# Patient Record
Sex: Female | Born: 1935 | Smoking: Never smoker
Health system: Southern US, Community
[De-identification: ages and names within clinical notes are randomized; demographics above are authoritative.]

## PROBLEM LIST (undated history)

## (undated) DIAGNOSIS — Z860100 Personal history of colon polyps, unspecified: Secondary | ICD-10-CM

## (undated) DIAGNOSIS — H409 Unspecified glaucoma: Secondary | ICD-10-CM

## (undated) DIAGNOSIS — Z8619 Personal history of other infectious and parasitic diseases: Secondary | ICD-10-CM

## (undated) DIAGNOSIS — K219 Gastro-esophageal reflux disease without esophagitis: Secondary | ICD-10-CM

## (undated) HISTORY — DX: Personal history of other infectious and parasitic diseases: Z86.19

## (undated) HISTORY — DX: Personal history of colonic polyps: Z86.010

## (undated) HISTORY — DX: Unspecified glaucoma: H40.9

## (undated) HISTORY — DX: Gastro-esophageal reflux disease without esophagitis: K21.9

## (undated) HISTORY — PX: VEIN LIGATION: SHX2652

## (undated) HISTORY — DX: Personal history of colon polyps, unspecified: Z86.0100

## (undated) HISTORY — PX: EYE SURGERY: SHX253

---
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
## 1962-08-22 HISTORY — PX: ABDOMINAL HYSTERECTOMY: SHX81

## 2005-01-06 ENCOUNTER — Ambulatory Visit: Payer: Self-pay | Admitting: Gastroenterology

## 2005-01-25 ENCOUNTER — Ambulatory Visit: Payer: Self-pay | Admitting: Internal Medicine

## 2006-03-29 ENCOUNTER — Ambulatory Visit: Payer: Self-pay | Admitting: Internal Medicine

## 2007-05-11 ENCOUNTER — Ambulatory Visit: Payer: Self-pay | Admitting: Internal Medicine

## 2007-07-12 ENCOUNTER — Ambulatory Visit: Payer: Self-pay | Admitting: Internal Medicine

## 2008-10-07 ENCOUNTER — Ambulatory Visit: Payer: Self-pay | Admitting: Internal Medicine

## 2009-01-29 ENCOUNTER — Ambulatory Visit: Payer: Self-pay | Admitting: Unknown Physician Specialty

## 2009-03-24 ENCOUNTER — Ambulatory Visit: Payer: Self-pay | Admitting: Unknown Physician Specialty

## 2010-02-04 ENCOUNTER — Ambulatory Visit: Payer: Self-pay | Admitting: Internal Medicine

## 2010-08-07 ENCOUNTER — Emergency Department: Payer: Self-pay | Admitting: Unknown Physician Specialty

## 2011-02-24 ENCOUNTER — Ambulatory Visit: Payer: Self-pay | Admitting: Internal Medicine

## 2012-05-08 ENCOUNTER — Ambulatory Visit: Payer: Self-pay | Admitting: Internal Medicine

## 2012-06-04 ENCOUNTER — Other Ambulatory Visit: Payer: Self-pay | Admitting: Internal Medicine

## 2012-06-04 NOTE — Telephone Encounter (Signed)
Pt is needing refill on Alprazolam. Shes use [facility] in [place].

## 2012-06-04 NOTE — Telephone Encounter (Signed)
I don't see where an appt is scheduled here.  Is she planning on following here.  If so, can refill x 1 and make appt.

## 2012-06-05 NOTE — Telephone Encounter (Signed)
Called pt cell phone, left message to return call

## 2012-06-08 NOTE — Telephone Encounter (Signed)
Called and left another message on cell.

## 2012-06-15 NOTE — Telephone Encounter (Signed)
Patients states that she is going to go to a different doctor and that she is going to get her rx filled through them.

## 2012-08-08 ENCOUNTER — Telehealth: Payer: Self-pay | Admitting: Internal Medicine

## 2012-08-08 NOTE — Telephone Encounter (Signed)
Pt is calling and wanting to know if she could speak with you or the nurse. She says she has some questions. Pt would not go into detail with me at all.

## 2012-08-09 NOTE — Telephone Encounter (Signed)
Called patient back. Left message for patient to return call.

## 2012-08-14 NOTE — Telephone Encounter (Signed)
Former patient, she wanted to let you know she is seeing someone else in [place]. She is trying to get in to see you.

## 2012-08-14 NOTE — Telephone Encounter (Signed)
Called again, left message for patient to return call.

## 2012-11-27 ENCOUNTER — Ambulatory Visit: Payer: Self-pay | Admitting: Internal Medicine

## 2013-01-17 ENCOUNTER — Ambulatory Visit: Payer: Self-pay | Admitting: Internal Medicine

## 2013-02-21 ENCOUNTER — Encounter: Payer: Self-pay | Admitting: Internal Medicine

## 2013-02-21 ENCOUNTER — Ambulatory Visit (INDEPENDENT_AMBULATORY_CARE_PROVIDER_SITE_OTHER): Payer: Medicare Other | Admitting: Internal Medicine

## 2013-02-21 VITALS — BP 110/70 | HR 95 | Temp 98.5°F | Ht 68.0 in | Wt 152.2 lb

## 2013-02-21 DIAGNOSIS — G4700 Insomnia, unspecified: Secondary | ICD-10-CM

## 2013-02-21 DIAGNOSIS — E7800 Pure hypercholesterolemia, unspecified: Secondary | ICD-10-CM

## 2013-02-21 DIAGNOSIS — Z1211 Encounter for screening for malignant neoplasm of colon: Secondary | ICD-10-CM

## 2013-02-21 DIAGNOSIS — K219 Gastro-esophageal reflux disease without esophagitis: Secondary | ICD-10-CM

## 2013-02-21 DIAGNOSIS — Z86010 Personal history of colonic polyps: Secondary | ICD-10-CM

## 2013-02-21 DIAGNOSIS — R5383 Other fatigue: Secondary | ICD-10-CM

## 2013-02-21 DIAGNOSIS — H409 Unspecified glaucoma: Secondary | ICD-10-CM

## 2013-02-21 LAB — COMPREHENSIVE METABOLIC PANEL
ALT: 17 U/L (ref 0–35)
AST: 24 U/L (ref 0–37)
CO2: 29 mEq/L (ref 19–32)
Creatinine, Ser: 0.9 mg/dL (ref 0.4–1.2)
GFR: 67.92 mL/min (ref >60.00)
Total Bilirubin: 1.4 mg/dL — ABNORMAL HIGH (ref 0.3–1.2)

## 2013-02-21 LAB — CBC WITH DIFFERENTIAL/PLATELET
Basophils Absolute: 0 10*3/uL (ref 0.0–0.1)
Eosinophils Absolute: 0.1 10*3/uL (ref 0.0–0.7)
HCT: 37.9 % (ref 36.0–46.0)
Hemoglobin: 13 g/dL (ref 12.0–15.0)
Lymphs Abs: 1.5 10*3/uL (ref 0.7–4.0)
MCHC: 34.4 g/dL (ref 30.0–36.0)
MCV: 91.9 fl (ref 78.0–100.0)
Neutro Abs: 3.4 10*3/uL (ref 1.4–7.7)
RDW: 14.5 % (ref 11.5–14.6)

## 2013-02-21 LAB — LDL CHOLESTEROL, DIRECT: Direct LDL: 151.2 mg/dL

## 2013-02-21 LAB — LIPID PANEL
HDL: 63 mg/dL (ref >39.00)
Triglycerides: 98 mg/dL (ref 0.0–149.0)

## 2013-02-21 MED ORDER — TRAZODONE HCL 50 MG PO TABS: 25.0000 mg | ORAL_TABLET | Freq: Every evening | ORAL | Status: DC | PRN

## 2013-02-24 ENCOUNTER — Other Ambulatory Visit: Payer: Self-pay | Admitting: Internal Medicine

## 2013-02-24 NOTE — Progress Notes (Signed)
Order placed for f/u lab check.

## 2013-02-26 ENCOUNTER — Encounter: Payer: Self-pay | Admitting: Internal Medicine

## 2013-02-26 DIAGNOSIS — H409 Unspecified glaucoma: Secondary | ICD-10-CM | POA: Insufficient documentation

## 2013-02-26 DIAGNOSIS — G4700 Insomnia, unspecified: Secondary | ICD-10-CM | POA: Insufficient documentation

## 2013-02-26 DIAGNOSIS — K219 Gastro-esophageal reflux disease without esophagitis: Secondary | ICD-10-CM | POA: Insufficient documentation

## 2013-02-26 DIAGNOSIS — E7800 Pure hypercholesterolemia, unspecified: Secondary | ICD-10-CM | POA: Insufficient documentation

## 2013-02-26 DIAGNOSIS — Z860100 Personal history of colon polyps, unspecified: Secondary | ICD-10-CM | POA: Insufficient documentation

## 2013-02-26 NOTE — Progress Notes (Signed)
Subjective:    Patient ID: Tara Elliott, female    DOB: [birth year], 79 y.o.   MRN: [id number]  HPI 79 year old female with past history of GERD and colonic polyps who comes in today to follow up on these issues as well as to transfer her care here to [facility].  She was a former pt of mine at [employer].  She has been seeing Dr Lafe Garin in [place].  She states that overall she feels she is doing well.  She is having trouble sleeping.  Has been a problem for her for a while.  Feels she needs something to help her sleep.  She also has some upper and lower back pain.  The lower back pain is present if she has been sitting for a while or lying down for a while.  When she goes to get up - feels stiff.  Once she starts moving - better.  The upper back seems to flare at times.  No pain now.  No chest pain or tightness.  No sob.  No significant drainage.  No cough or congestion.  Bowels stable.     Past Medical History  Diagnosis Date  . History of chicken pox   . GERD (gastroesophageal reflux disease)   . Hx of colonic polyps   . Glaucoma     Review of Systems Patient denies any headache, lightheadedness or dizziness.  No significant sinus or allergy symptoms.  No chest pain, tightness or palpitations.  No increased shortness of breath, cough or congestion.  No acid reflux.  No nausea or vomiting.  No abdominal pain or cramping.  No bowel change, such as diarrhea, constipation, BRBPR or melana.  No urine change.  Back discomfort as outlined.  Needs something to help her sleep.  See above.  She previously saw Dr Markham Jordan.  Has a history of colon polyps.  States is due a follow up colonoscopy.  Reports she had work up for her back in April.  Had testing in the hospital.  Need to obtain records.  No pain down her legs.  No numbness or tingling.        Objective:   Physical Exam Filed Vitals:   02/21/13 0937  BP: 110/70  Pulse: 95  Temp: 98.5 F (74.71 C)   79 year old female in no acute distress.    HEENT:  Nares- clear.  Oropharynx - without lesions. NECK:  Supple.  Nontender.  No audible bruit.  HEART:  Appears to be regular. LUNGS:  No crackles or wheezing audible.  Respirations even and unlabored.  RADIAL PULSE:  Equal bilaterally.  ABDOMEN:  Soft, nontender.  Bowel sounds present and normal.  No audible abdominal bruit.   EXTREMITIES:  No increased edema present.  DP pulses palpable and equal bilaterally.          Assessment & Plan:  BACK PAIN.  Lower back pain as outlined.  Appears to be more consistent with arthritis.  Consider physical therapy.  She will notify me when agreeable.  Also having some intermittent upper back pain.  No pain now.  Obtain records from w/u done in April.  Follow.   FATIGUE.  Check cbc, metc and tsh.  Probably multifactorial.  Will get her sleeping better.  Follow.    HEALTH MAINTENANCE.  Schedule a physical when due.  Schedule a referral to GI.  Pt states due for a f/u colonoscopy.    I spent 45 minutes with the pt and more than 50% of  the time was spent in consultation regarding the above.

## 2013-02-26 NOTE — Assessment & Plan Note (Signed)
Having difficulty sleeping.  No depression.  Has tried melatonin.  Feels she needs something more.  Will start trazodone 25-50mg  q hs.  Will follow.

## 2013-02-26 NOTE — Assessment & Plan Note (Signed)
Has seen Dr Markham Jordan previously.  States due a f/u colonoscopy.  Will place order for referral to GI.

## 2013-02-26 NOTE — Assessment & Plan Note (Signed)
Low cholesterol diet and exercise.  Check lipid panel.   

## 2013-02-26 NOTE — Assessment & Plan Note (Signed)
Controlled.  On no medication.  Follow.   

## 2013-02-26 NOTE — Assessment & Plan Note (Signed)
Followed by opthalmology.       

## 2013-03-01 ENCOUNTER — Encounter: Payer: Self-pay | Admitting: Emergency Medicine

## 2013-03-18 ENCOUNTER — Other Ambulatory Visit (INDEPENDENT_AMBULATORY_CARE_PROVIDER_SITE_OTHER): Payer: Medicare Other

## 2013-03-18 DIAGNOSIS — R17 Unspecified jaundice: Secondary | ICD-10-CM

## 2013-03-22 ENCOUNTER — Encounter: Payer: Self-pay | Admitting: *Deleted

## 2013-03-22 ENCOUNTER — Telehealth: Payer: Self-pay | Admitting: Internal Medicine

## 2013-03-22 NOTE — Progress Notes (Signed)
Tried to reach pt by phone, no answer & no voicemail- per [facility] only had a pelvic U/S from 03/24/09 (in your folder)

## 2013-03-22 NOTE — Telephone Encounter (Signed)
Tried to reach pt again, still no answer

## 2013-03-22 NOTE — Telephone Encounter (Signed)
I ordered an abdominal ultrasound.  Please notify pt that I would like to do an abdominal ultrasound given her pain.

## 2013-03-22 NOTE — Telephone Encounter (Signed)
Message copied by Charm Barges on Fri Mar 22, 2013  3:20 PM ------      Message from: Warden Fillers      Created: Fri Mar 22, 2013  1:46 PM       Mailed patient a Physicist, medical. Tried to reach pt by phone, no answer & no voicemail- per [facility] only had a pelvic U/S from 03/24/09 (in your folder) ------

## 2013-03-27 NOTE — Telephone Encounter (Signed)
Daughter notified today

## 2013-03-28 ENCOUNTER — Encounter: Payer: Self-pay | Admitting: Unknown Physician Specialty

## 2013-03-28 ENCOUNTER — Ambulatory Visit: Payer: Self-pay | Admitting: Internal Medicine

## 2013-03-29 ENCOUNTER — Encounter: Payer: Self-pay | Admitting: *Deleted

## 2013-04-12 ENCOUNTER — Encounter: Payer: Self-pay | Admitting: Internal Medicine

## 2013-05-20 ENCOUNTER — Encounter: Payer: Self-pay | Admitting: Internal Medicine

## 2013-05-22 MED ORDER — ALPRAZOLAM 0.25 MG PO TABS: 0.2500 mg | ORAL_TABLET | Freq: Every day | ORAL | Status: DC | PRN

## 2013-05-22 NOTE — Telephone Encounter (Signed)
I refilled the xanax .25mg  q day prn #20 with no refills.   Please notify her daughter.  Thanks.

## 2013-06-05 ENCOUNTER — Encounter: Payer: Self-pay | Admitting: *Deleted

## 2013-06-06 ENCOUNTER — Encounter: Payer: Medicare Other | Admitting: Internal Medicine

## 2013-06-10 ENCOUNTER — Telehealth: Payer: Self-pay | Admitting: Internal Medicine

## 2013-06-10 ENCOUNTER — Encounter: Payer: Self-pay | Admitting: Internal Medicine

## 2013-06-10 NOTE — Telephone Encounter (Signed)
This pt's daughter called and pt missed her appt.  Her daughter is gong to call and make another appt.  She needs to be a spot (always) and make at the end of 1/2 day.  Thanks.

## 2013-06-25 NOTE — Telephone Encounter (Signed)
Appointment made 11/20

## 2013-06-27 ENCOUNTER — Other Ambulatory Visit: Payer: Self-pay

## 2013-07-04 ENCOUNTER — Encounter: Payer: Self-pay | Admitting: Internal Medicine

## 2013-07-10 ENCOUNTER — Encounter: Payer: Self-pay | Admitting: *Deleted

## 2013-07-11 ENCOUNTER — Ambulatory Visit (INDEPENDENT_AMBULATORY_CARE_PROVIDER_SITE_OTHER): Payer: Medicare Other | Admitting: Internal Medicine

## 2013-07-11 ENCOUNTER — Encounter: Payer: Self-pay | Admitting: Internal Medicine

## 2013-07-11 VITALS — BP 120/60 | HR 77 | Temp 98.0°F | Ht 68.0 in | Wt 160.0 lb

## 2013-07-11 DIAGNOSIS — Z86010 Personal history of colonic polyps: Secondary | ICD-10-CM

## 2013-07-11 DIAGNOSIS — R55 Syncope and collapse: Secondary | ICD-10-CM

## 2013-07-11 DIAGNOSIS — H5316 Psychophysical visual disturbances: Secondary | ICD-10-CM

## 2013-07-11 DIAGNOSIS — R5381 Other malaise: Secondary | ICD-10-CM

## 2013-07-11 DIAGNOSIS — K219 Gastro-esophageal reflux disease without esophagitis: Secondary | ICD-10-CM

## 2013-07-11 DIAGNOSIS — G4700 Insomnia, unspecified: Secondary | ICD-10-CM

## 2013-07-11 DIAGNOSIS — E7800 Pure hypercholesterolemia, unspecified: Secondary | ICD-10-CM

## 2013-07-11 DIAGNOSIS — R5383 Other fatigue: Secondary | ICD-10-CM

## 2013-07-11 DIAGNOSIS — W19XXXA Unspecified fall, initial encounter: Secondary | ICD-10-CM

## 2013-07-11 DIAGNOSIS — H409 Unspecified glaucoma: Secondary | ICD-10-CM

## 2013-07-11 DIAGNOSIS — R441 Visual hallucinations: Secondary | ICD-10-CM

## 2013-07-11 DIAGNOSIS — Z23 Encounter for immunization: Secondary | ICD-10-CM

## 2013-07-11 DIAGNOSIS — R413 Other amnesia: Secondary | ICD-10-CM

## 2013-07-11 DIAGNOSIS — R079 Chest pain, unspecified: Secondary | ICD-10-CM

## 2013-07-11 LAB — CBC WITH DIFFERENTIAL/PLATELET
Basophils Relative: 1.6 % (ref 0.0–3.0)
Eosinophils Relative: 3.4 % (ref 0.0–5.0)
HCT: 36.8 % (ref 36.0–46.0)
Hemoglobin: 12.6 g/dL (ref 12.0–15.0)
Lymphs Abs: 1.6 10*3/uL (ref 0.7–4.0)
MCV: 89.8 fl (ref 78.0–100.0)
Monocytes Absolute: 0.6 10*3/uL (ref 0.1–1.0)
Neutro Abs: 1.8 10*3/uL (ref 1.4–7.7)
RBC: 4.1 Mil/uL (ref 3.87–5.11)
WBC: 4.2 10*3/uL — ABNORMAL LOW (ref 4.5–10.5)

## 2013-07-11 LAB — VITAMIN B12: Vitamin B-12: 207 pg/mL — ABNORMAL LOW (ref 211–911)

## 2013-07-11 LAB — COMPREHENSIVE METABOLIC PANEL
Albumin: 4.2 g/dL (ref 3.5–5.2)
Alkaline Phosphatase: 38 U/L — ABNORMAL LOW (ref 39–117)
BUN: 21 mg/dL (ref 6–23)
Glucose, Bld: 80 mg/dL (ref 70–99)
Potassium: 4.3 mEq/L (ref 3.5–5.1)
Total Bilirubin: 1.2 mg/dL (ref 0.3–1.2)

## 2013-07-11 LAB — TSH: TSH: 0.71 u[IU]/mL (ref 0.35–5.50)

## 2013-07-11 MED ORDER — HYDROCORTISONE 2.5 % EX CREA: TOPICAL_CREAM | Freq: Two times a day (BID) | CUTANEOUS | Status: AC

## 2013-07-11 MED ORDER — ALPRAZOLAM 0.25 MG PO TABS: 0.2500 mg | ORAL_TABLET | Freq: Every day | ORAL | Status: DC | PRN

## 2013-07-11 NOTE — Progress Notes (Signed)
Pre-visit discussion using our clinic review tool. No additional management support is needed unless otherwise documented below in the visit note.  

## 2013-07-12 ENCOUNTER — Other Ambulatory Visit: Payer: Self-pay | Admitting: Internal Medicine

## 2013-07-12 ENCOUNTER — Encounter: Payer: Self-pay | Admitting: Emergency Medicine

## 2013-07-12 DIAGNOSIS — N289 Disorder of kidney and ureter, unspecified: Secondary | ICD-10-CM

## 2013-07-12 DIAGNOSIS — E7800 Pure hypercholesterolemia, unspecified: Secondary | ICD-10-CM

## 2013-07-12 DIAGNOSIS — D72819 Decreased white blood cell count, unspecified: Secondary | ICD-10-CM

## 2013-07-12 NOTE — Progress Notes (Signed)
Orders placed for f/u labs.  

## 2013-07-14 ENCOUNTER — Encounter: Payer: Self-pay | Admitting: Internal Medicine

## 2013-07-14 DIAGNOSIS — R441 Visual hallucinations: Secondary | ICD-10-CM | POA: Insufficient documentation

## 2013-07-14 DIAGNOSIS — W19XXXA Unspecified fall, initial encounter: Secondary | ICD-10-CM | POA: Insufficient documentation

## 2013-07-14 DIAGNOSIS — R079 Chest pain, unspecified: Secondary | ICD-10-CM | POA: Insufficient documentation

## 2013-07-14 DIAGNOSIS — R413 Other amnesia: Secondary | ICD-10-CM | POA: Insufficient documentation

## 2013-07-14 NOTE — Assessment & Plan Note (Signed)
Mini mental status changes as outlined.  Daughter reports worsening memory change recently.  Had the fall a few weeks ago.  Will check MRI.  Labs as outlined.  Refer to neurology.

## 2013-07-14 NOTE — Progress Notes (Signed)
Subjective:    Patient ID: Tara Elliott, female    DOB: 1934-07-01, 79 y.o.   MRN: [id number]  HPI 79 year old female with past history of GERD and colonic polyps who comes in today for a scheduled follow up.  She states that overall she feels she is doing well.  She is accompanied by her daughter.  History obtained from both of them.  She had been having trouble sleeping.  Had been a problem for her for a while.  Was given trazodone to try last visit.  Did not work for her (per her report).  She did fall - after starting the medication.  Remembers sitting down in her make up chair.  Next thing she remembers is that she is getting up off the bathroom floor.  Did fall on her face.  Daughter reports not bruising.  The fall was not witnessed.  No residual headache.  No dizziness.  This was approximately two to three weeks.ago.  The lower back pain is present if she has been sitting for a while or lying down for a while.  When she goes to get up - feels stiff.  Once she starts moving - better.  This is unchanged for her.  The upper back seems to flare at times.  No pain now.  Occasionally will notice some pain in her sternum.  Unclear when occurs.  Vague symptoms.  No sob.  No significant drainage.  No cough or congestion.  Bowels stable.  She has been having visual hallucinations.  Sees her deceased mother and father.  Also sees her daughter and her grandson when they are not actually there.  She states it is like a motion picture.  Is aware that her mother and father are not living.     Past Medical History  Diagnosis Date  . History of chicken pox   . GERD (gastroesophageal reflux disease)   . Hx of colonic polyps   . Glaucoma     Outpatient Encounter Prescriptions as of 07/11/2013  Medication Sig  . ALPRAZolam (XANAX) 0.25 MG tablet Take 1 tablet (0.25 mg total) by mouth daily as needed.  . [DISCONTINUED] ALPRAZolam (XANAX) 0.25 MG tablet Take 1 tablet (0.25 mg total) by mouth daily as needed.   . hydrocortisone 2.5 % cream Apply topically 2 (two) times daily.  . [DISCONTINUED] traZODone (DESYREL) 50 MG tablet Take 0.5-1 tablets (25-50 mg total) by mouth at bedtime as needed for sleep.    Review of Systems Patient denies any headache, lightheadedness or dizziness.  No significant sinus or allergy symptoms.  No chest tightness or palpitations.  Some pain occasionally noticed over the sternum.  No increased shortness of breath, cough or congestion.  No acid reflux.  No nausea or vomiting.  No abdominal pain or cramping.  No bowel change, such as diarrhea, constipation, BRBPR or melana.  No urine change.  Back discomfort as outlined.  She previously saw Dr Markham Jordan.  Has a history of colon polyps.  States is due a follow up colonoscopy.  Visual hallucinations as outlined.  Fall as outlined.         Objective:   Physical Exam  Filed Vitals:   07/11/13 1045  BP: 120/60  Pulse: 77  Temp: 98 F (90.36 C)   79 year old female in no acute distress.   HEENT:  Nares- clear.  Oropharynx - without lesions. NECK:  Supple.  Nontender.  No audible bruit.  HEART:  Appears to be regular. LUNGS:  No crackles or wheezing audible.  Respirations even and unlabored.  RADIAL PULSE:  Equal bilaterally.    BREASTS:  No nipple discharge or nipple retraction present.  Could not appreciate any distinct nodules or axillary adenopathy.  ABDOMEN:  Soft, nontender.  Bowel sounds present and normal.  No audible abdominal bruit.  GU:  Not performed.   EXTREMITIES:  No increased edema present.  DP pulses palpable and equal bilaterally.          Assessment & Plan:  BACK PAIN.  Lower back pain as outlined.  Appears to be more c/w arthritis.  Stable.    HEALTH MAINTENANCE.  Physical today.   Had scheduled a referral to GI.  Pt states due for a f/u colonoscopy.    I spent 40 minutes with the pt and more than 50% of the time was spent in consultation regarding the above.

## 2013-07-14 NOTE — Assessment & Plan Note (Signed)
Hallucinations as outlined.  Mini mental status performed.  Knew the month and year.  Knew thanksgiving was near.  Did not know the date.  Able to spell WORLD backwards.  Able to subtract 100-7, but was unable to subtract 93-7.  Able to recall 1/3 objects after five minutes.  Discussed possible etiologies of hallucinations.  Will check routine labs and tsh, B12 and RPR.  Will also obtain MRI.  Refer to neurology.  Hold medication.

## 2013-07-14 NOTE — Assessment & Plan Note (Signed)
Low cholesterol diet and exercise.  Check lipid panel.   

## 2013-07-14 NOTE — Assessment & Plan Note (Addendum)
Unclear as to the exact etiology.  See HPI for details.  Avoid trazodone.  EKG as outlined.  Declines further cardiac w/up.  No reoccurring episodes.  Follow.  Check mri as outlined.

## 2013-07-14 NOTE — Assessment & Plan Note (Addendum)
Pain as outlined.  Intermittent.  EKG obtained and revealed SR with no acute ischemic changes.  Pain does not appear to be brought on by activity or exertion.   Desires no further cardiac w/up.  Follow.

## 2013-07-14 NOTE — Assessment & Plan Note (Signed)
Did not tolerate the trazodone.  Has xanax if needed.  Rarely uses.

## 2013-07-14 NOTE — Assessment & Plan Note (Signed)
Followed by opthalmology.       

## 2013-07-14 NOTE — Assessment & Plan Note (Signed)
Has seen Dr Markham Jordan previously.  States due a f/u colonoscopy.  Referred to GI last visit.

## 2013-07-14 NOTE — Assessment & Plan Note (Signed)
Controlled.  On no medication.  Follow.   

## 2013-07-15 ENCOUNTER — Ambulatory Visit (INDEPENDENT_AMBULATORY_CARE_PROVIDER_SITE_OTHER): Payer: Medicare Other | Admitting: *Deleted

## 2013-07-15 DIAGNOSIS — E538 Deficiency of other specified B group vitamins: Secondary | ICD-10-CM

## 2013-07-15 MED ORDER — CYANOCOBALAMIN 1000 MCG/ML IJ SOLN
1000.0000 ug | Freq: Once | INTRAMUSCULAR | Status: AC
  Administered 2013-07-15: 1000 ug via INTRAMUSCULAR

## 2013-07-22 ENCOUNTER — Ambulatory Visit: Payer: Medicare Other

## 2013-07-22 ENCOUNTER — Ambulatory Visit (INDEPENDENT_AMBULATORY_CARE_PROVIDER_SITE_OTHER): Payer: Medicare Other | Admitting: *Deleted

## 2013-07-22 ENCOUNTER — Other Ambulatory Visit (INDEPENDENT_AMBULATORY_CARE_PROVIDER_SITE_OTHER): Payer: Medicare Other

## 2013-07-22 DIAGNOSIS — N289 Disorder of kidney and ureter, unspecified: Secondary | ICD-10-CM

## 2013-07-22 DIAGNOSIS — E7800 Pure hypercholesterolemia, unspecified: Secondary | ICD-10-CM

## 2013-07-22 DIAGNOSIS — E538 Deficiency of other specified B group vitamins: Secondary | ICD-10-CM

## 2013-07-22 DIAGNOSIS — D72819 Decreased white blood cell count, unspecified: Secondary | ICD-10-CM

## 2013-07-22 LAB — CBC WITH DIFFERENTIAL/PLATELET
Basophils Absolute: 0.1 10*3/uL (ref 0.0–0.1)
Basophils Relative: 1.6 % (ref 0.0–3.0)
Eosinophils Absolute: 0.1 10*3/uL (ref 0.0–0.7)
HCT: 37.7 % (ref 36.0–46.0)
Hemoglobin: 12.9 g/dL (ref 12.0–15.0)
Lymphocytes Relative: 38.5 % (ref 12.0–46.0)
Lymphs Abs: 1.4 10*3/uL (ref 0.7–4.0)
MCHC: 34.2 g/dL (ref 30.0–36.0)
MCV: 90.1 fl (ref 78.0–100.0)
Monocytes Absolute: 0.4 10*3/uL (ref 0.1–1.0)
Neutro Abs: 1.6 10*3/uL (ref 1.4–7.7)
Neutrophils Relative %: 43.6 % (ref 43.0–77.0)
RBC: 4.18 Mil/uL (ref 3.87–5.11)
RDW: 13.6 % (ref 11.5–14.6)

## 2013-07-22 LAB — LDL CHOLESTEROL, DIRECT: Direct LDL: 167.4 mg/dL

## 2013-07-22 LAB — LIPID PANEL
Cholesterol: 243 mg/dL — ABNORMAL HIGH (ref 0–200)
VLDL: 18 mg/dL (ref 0.0–40.0)

## 2013-07-22 MED ORDER — CYANOCOBALAMIN 1000 MCG/ML IJ SOLN
1000.0000 ug | Freq: Once | INTRAMUSCULAR | Status: AC
  Administered 2013-07-22: 1000 ug via INTRAMUSCULAR

## 2013-07-23 ENCOUNTER — Other Ambulatory Visit: Payer: Self-pay | Admitting: Internal Medicine

## 2013-07-23 ENCOUNTER — Encounter: Payer: Self-pay | Admitting: Internal Medicine

## 2013-07-23 DIAGNOSIS — D72819 Decreased white blood cell count, unspecified: Secondary | ICD-10-CM

## 2013-07-23 NOTE — Progress Notes (Signed)
Order placed for f/u cbc.   

## 2013-07-31 ENCOUNTER — Encounter: Payer: Self-pay | Admitting: Internal Medicine

## 2013-07-31 ENCOUNTER — Ambulatory Visit: Payer: Medicare Other

## 2013-08-05 ENCOUNTER — Encounter: Payer: Self-pay | Admitting: Internal Medicine

## 2013-08-07 ENCOUNTER — Telehealth: Payer: Self-pay | Admitting: Internal Medicine

## 2013-08-07 DIAGNOSIS — Z0279 Encounter for issue of other medical certificate: Secondary | ICD-10-CM

## 2013-08-07 NOTE — Telephone Encounter (Signed)
In your folder 

## 2013-08-07 NOTE — Telephone Encounter (Signed)
Paperwork dropped off, put in Dr. Roby Lofts box.

## 2013-08-07 NOTE — Telephone Encounter (Signed)
Need to know what this is being filled out for .

## 2013-08-09 ENCOUNTER — Encounter: Payer: Self-pay | Admitting: Internal Medicine

## 2013-08-09 NOTE — Telephone Encounter (Signed)
See my chart message

## 2013-08-09 NOTE — Telephone Encounter (Signed)
LMTCB

## 2013-08-29 ENCOUNTER — Ambulatory Visit: Payer: Self-pay | Admitting: Neurology

## 2013-11-05 ENCOUNTER — Telehealth: Payer: Self-pay | Admitting: Internal Medicine

## 2013-11-05 NOTE — Telephone Encounter (Signed)
Pt declined appt & will call back tomorrow after speak with daughter about transportation

## 2013-11-05 NOTE — Telephone Encounter (Signed)
I can see her tomorrow if she wants to come in at 8:30 (block 30 minutes).  Check and make sure ok.  Will be worked in for this.

## 2013-11-05 NOTE — Telephone Encounter (Signed)
[place] patient [place] at church [place] 3[place]15[place]15 . 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 She scrapped her shin she has lumps and bruises.

## 2013-11-05 NOTE — Telephone Encounter (Signed)
Offered pt an appointment tomorrow at 8:30 & pt would rather message be sent to you because patients states that you know her. (see below)

## 2014-02-11 ENCOUNTER — Other Ambulatory Visit: Payer: Self-pay | Admitting: Internal Medicine

## 2014-02-12 NOTE — Telephone Encounter (Signed)
She does not have a f/u scheduled with me.  Needs to schedule a f/u appt.  See how often she is taking the medication and then can see about refill prior to her appt.  Just let me know.

## 2014-02-12 NOTE — Telephone Encounter (Signed)
Last OV 11.20.14, last refill 3.30.15.  please advise refill.

## 2014-02-12 NOTE — Telephone Encounter (Signed)
Last refill 3.30.15, last OV 12.1.14.  Please advise refill.

## 2014-02-12 NOTE — Telephone Encounter (Signed)
See my last message regarding refilling medication.  Need more info and she needs an appt.  Thanks.

## 2015-08-18 ENCOUNTER — Encounter: Payer: Self-pay | Admitting: Internal Medicine

## 2015-08-19 NOTE — Telephone Encounter (Signed)
I have not see her since 2014.  With these symptoms, I would recommend going ahead and being evaluated - either here or [facility] (since they live in [place]) - since I am not in the office the rest of the day today or the rest of the week.  We can then f/u after visit.

## 2015-09-10 DIAGNOSIS — G3183 Dementia with Lewy bodies: Secondary | ICD-10-CM | POA: Diagnosis not present

## 2015-09-10 DIAGNOSIS — F0281 Dementia in other diseases classified elsewhere with behavioral disturbance: Secondary | ICD-10-CM | POA: Diagnosis not present

## 2015-09-28 ENCOUNTER — Encounter: Payer: Self-pay | Admitting: Internal Medicine

## 2015-09-28 DIAGNOSIS — E7800 Pure hypercholesterolemia, unspecified: Secondary | ICD-10-CM

## 2015-09-28 DIAGNOSIS — E538 Deficiency of other specified B group vitamins: Secondary | ICD-10-CM

## 2015-09-29 NOTE — Telephone Encounter (Signed)
I have placed orders for the labs.  I am ok if she comes in am of 10/06/15 as she requested.  See her my chart message.

## 2015-10-06 ENCOUNTER — Encounter: Payer: Self-pay | Admitting: Internal Medicine

## 2015-10-06 ENCOUNTER — Ambulatory Visit (INDEPENDENT_AMBULATORY_CARE_PROVIDER_SITE_OTHER): Payer: PPO | Admitting: Internal Medicine

## 2015-10-06 VITALS — BP 102/70 | HR 76 | Temp 97.9°F | Resp 18 | Ht 68.0 in | Wt 145.8 lb

## 2015-10-06 DIAGNOSIS — R319 Hematuria, unspecified: Secondary | ICD-10-CM

## 2015-10-06 DIAGNOSIS — Z23 Encounter for immunization: Secondary | ICD-10-CM

## 2015-10-06 DIAGNOSIS — Z0001 Encounter for general adult medical examination with abnormal findings: Secondary | ICD-10-CM | POA: Diagnosis not present

## 2015-10-06 DIAGNOSIS — E538 Deficiency of other specified B group vitamins: Secondary | ICD-10-CM | POA: Diagnosis not present

## 2015-10-06 DIAGNOSIS — Z86010 Personal history of colonic polyps: Secondary | ICD-10-CM

## 2015-10-06 DIAGNOSIS — R413 Other amnesia: Secondary | ICD-10-CM

## 2015-10-06 DIAGNOSIS — N6489 Other specified disorders of breast: Secondary | ICD-10-CM | POA: Diagnosis not present

## 2015-10-06 DIAGNOSIS — Z1239 Encounter for other screening for malignant neoplasm of breast: Secondary | ICD-10-CM | POA: Diagnosis not present

## 2015-10-06 DIAGNOSIS — E7800 Pure hypercholesterolemia, unspecified: Secondary | ICD-10-CM | POA: Diagnosis not present

## 2015-10-06 DIAGNOSIS — Z0000 Encounter for general adult medical examination without abnormal findings: Secondary | ICD-10-CM

## 2015-10-06 DIAGNOSIS — Z961 Presence of intraocular lens: Secondary | ICD-10-CM | POA: Diagnosis not present

## 2015-10-06 LAB — LIPID PANEL
CHOL/HDL RATIO: 5
CHOLESTEROL: 241 mg/dL — AB (ref 0–200)
HDL: 50.2 mg/dL (ref >39.00)
LDL Cholesterol: 154 mg/dL — ABNORMAL HIGH (ref 0–99)
NonHDL: 191
TRIGLYCERIDES: 183 mg/dL — AB (ref 0.0–149.0)
VLDL: 36.6 mg/dL (ref 0.0–40.0)

## 2015-10-06 LAB — CBC WITH DIFFERENTIAL/PLATELET
BASOS ABS: 0 10*3/uL (ref 0.0–0.1)
Basophils Relative: 0.5 % (ref 0.0–3.0)
Eosinophils Absolute: 0.1 10*3/uL (ref 0.0–0.7)
Eosinophils Relative: 1.7 % (ref 0.0–5.0)
HCT: 40.5 % (ref 36.0–46.0)
Hemoglobin: 13.7 g/dL (ref 12.0–15.0)
Lymphocytes Relative: 25.5 % (ref 12.0–46.0)
Lymphs Abs: 1.5 10*3/uL (ref 0.7–4.0)
MCHC: 33.8 g/dL (ref 30.0–36.0)
MCV: 89.9 fl (ref 78.0–100.0)
MONO ABS: 0.5 10*3/uL (ref 0.1–1.0)
MONOS PCT: 7.9 % (ref 3.0–12.0)
NEUTROS PCT: 64.4 % (ref 43.0–77.0)
Neutro Abs: 3.8 10*3/uL (ref 1.4–7.7)
Platelets: 216 10*3/uL (ref 150.0–400.0)
RBC: 4.5 Mil/uL (ref 3.87–5.11)
RDW: 13.8 % (ref 11.5–15.5)
WBC: 5.8 10*3/uL (ref 4.0–10.5)

## 2015-10-06 LAB — TSH: TSH: 0.69 u[IU]/mL (ref 0.35–4.50)

## 2015-10-06 LAB — HEPATIC FUNCTION PANEL
ALBUMIN: 4.2 g/dL (ref 3.5–5.2)
ALT: 11 U/L (ref 0–35)
AST: 17 U/L (ref 0–37)
Alkaline Phosphatase: 42 U/L (ref 39–117)
BILIRUBIN DIRECT: 0.1 mg/dL (ref 0.0–0.3)
BILIRUBIN TOTAL: 0.9 mg/dL (ref 0.2–1.2)
Total Protein: 7 g/dL (ref 6.0–8.3)

## 2015-10-06 LAB — BASIC METABOLIC PANEL
BUN: 20 mg/dL (ref 6–23)
CALCIUM: 9.6 mg/dL (ref 8.4–10.5)
CO2: 25 mEq/L (ref 19–32)
Chloride: 109 mEq/L (ref 96–112)
Creatinine, Ser: 0.94 mg/dL (ref 0.40–1.20)
GFR: 60.88 mL/min (ref >60.00)
GLUCOSE: 89 mg/dL (ref 70–99)
POTASSIUM: 4 meq/L (ref 3.5–5.1)
SODIUM: 142 meq/L (ref 135–145)

## 2015-10-06 LAB — VITAMIN B12

## 2015-10-06 NOTE — Progress Notes (Signed)
Pre-visit discussion using our clinic review tool. No additional management support is needed unless otherwise documented below in the visit note.  

## 2015-10-06 NOTE — Progress Notes (Signed)
Patient ID: Tara Elliott, female   DOB: 1934-08-12, 82 y.o.   MRN: [id number]   Subjective:    Patient ID: Tara Elliott, female    DOB: 11/20/1933, 82 y.o.   MRN: [id number]  HPI  Patient with past history of GERD, hypercholesterolemia and dementia on aricept and followed by neurology.  She comes in today to follow up on these issues as well as for a complete physical exam.  She is accompanied by her daughter.  History obtained from both of them.  She tries to stay active.  Has lost weight.  Daughter states eating well.  No chest pain or tightness.  No sob.  No abdominal pain or cramping.  Bowels stable.  States noticed pain in her left breast.  Concern over possible lump.  Noticed 4-5 weeks ago.  Gets up at night to urinate.  Previously noted blood in urine.     Past Medical History  Diagnosis Date  . History of chicken pox   . GERD (gastroesophageal reflux disease)   . Hx of colonic polyps   . Glaucoma    Past Surgical History  Procedure Laterality Date  . Appendectomy  06/01/1954  . Tonsillectomy and adenoidectomy  05/11/1954  . Abdominal hysterectomy  1964    partial  . Eye surgery      several  . Vein ligation      left leg   Family History  Problem Relation Age of Onset  . Stroke Mother   . Hypertension Mother    Social History   Social History  . Marital Status: Widowed    Spouse Name: N/A  . Number of Children: 1  . Years of Education: N/A   Social History Main Topics  . Smoking status: Never Smoker   . Smokeless tobacco: Never Used  . Alcohol Use: No  . Drug Use: No  . Sexual Activity: Not Asked   Other Topics Concern  . None   Social History Narrative    Outpatient Encounter Prescriptions as of 10/06/2015  Medication Sig  . Cyanocobalamin (B-12) 3000 MCG CAPS Take by mouth.  . donepezil (ARICEPT) 10 MG tablet Take 10 mg by mouth at bedtime.  . hydrocortisone 2.5 % cream Apply topically 2 (two) times daily.  . Melatonin-Pyridoxine  (MELATONIN/VITAMIN B-6 EX ST) 5-1 MG TABS Take by mouth.  . [DISCONTINUED] ALPRAZolam (XANAX) 0.25 MG tablet Take 1 tablet (0.25 mg total) by mouth daily as needed.   No facility-administered encounter medications on file as of 10/06/2015.    Review of Systems  Constitutional: Negative for fever and chills.  HENT: Negative for congestion, sinus pressure and sore throat.   Eyes: Negative for pain and visual disturbance.  Respiratory: Negative for cough, chest tightness and shortness of breath.   Cardiovascular: Negative for chest pain, palpitations and leg swelling.  Gastrointestinal: Negative for nausea, vomiting, abdominal pain and diarrhea.  Genitourinary: Negative for dysuria and difficulty urinating.  Musculoskeletal: Negative for back pain and joint swelling.  Skin: Negative for color change and rash.  Neurological: Negative for dizziness, light-headedness and headaches.  Hematological: Negative for adenopathy. Does not bruise/bleed easily.  Psychiatric/Behavioral: Negative for dysphoric mood and agitation.       Objective:    Physical Exam  Constitutional: She appears well-developed and well-nourished. No distress.  HENT:  Nose: Nose normal.  Mouth/Throat: Oropharynx is clear and moist.  Eyes: Conjunctivae are normal. Right eye exhibits no discharge. Left eye exhibits no discharge.  Neck: Neck supple. No  thyromegaly present.  Cardiovascular: Normal rate and regular rhythm.   Pulmonary/Chest: Breath sounds normal. No respiratory distress. She has no wheezes.  Breast exam reveals no nipple discharge present.  Some minimal tenderness to palpation left breast.  No palpable axillary adenopathy.    Abdominal: Soft. Bowel sounds are normal. There is no tenderness.  Musculoskeletal: She exhibits no edema or tenderness.  Lymphadenopathy:    She has no cervical adenopathy.  Skin: No rash noted. No erythema.  Psychiatric: She has a normal mood and affect. Her behavior is normal.     BP 102/70 mmHg  Pulse 76  Temp(Src) 97.9 F (36.6 C) (Oral)  Resp 18  Ht  (1.727 m)  Wt 145 lb 12 oz (66.112 kg)  BMI 22.17 kg/m2  SpO2 95% Wt Readings from Last 3 Encounters:  10/06/15 145 lb 12 oz (66.112 kg)  07/11/13 160 lb (72.576 kg)  02/21/13 152 lb 4 oz (69.06 kg)     Lab Results  Component Value Date   WBC 5.8 10/06/2015   HGB 13.7 10/06/2015   HCT 40.5 10/06/2015   PLT 216.0 10/06/2015   GLUCOSE 89 10/06/2015   CHOL 241* 10/06/2015   TRIG 183.0* 10/06/2015   HDL 50.20 10/06/2015   LDLDIRECT 167.4 07/22/2013   LDLCALC 154* 10/06/2015   ALT 11 10/06/2015   AST 17 10/06/2015   NA 142 10/06/2015   K 4.0 10/06/2015   CL 109 10/06/2015   CREATININE 0.94 10/06/2015   BUN 20 10/06/2015   CO2 25 10/06/2015   TSH 0.69 10/06/2015       Assessment & Plan:   Problem List Items Addressed This Visit    Fullness of breast - Primary    Exam as outlined.  Schedule diagnostic mammogram and ultrasound.        Relevant Orders   MM Digital Diagnostic Bilat   US BREAST LTD UNI RIGHT INC AXILLA   US BREAST LTD UNI LEFT INC AXILLA   Health care maintenance    Physical today 10/06/15.  cologuard as outlined.  Schedule diagnostic mammogram as outlined.        Hypercholesterolemia    Low cholesterol diet and exercise.  Follow lipid panel.        Memory change    Has dementia.  Sees neurology.  Refer to their last note for details.  On aricept.  Follow.        Personal history of colonic polyps    Has seen Dr Markham Jordan.  Due a f/u colonoscopy.  Discussed today.  Declines referral at this time.  Agreed to cologuard.         Other Visit Diagnoses    B12 deficiency        Screening breast examination        Hematuria        Relevant Orders    Urinalysis, Routine w reflex microscopic (not at [facility]) (Completed)    CULTURE, URINE COMPREHENSIVE (Completed)    Encounter for immunization        Relevant Orders    Flu Vaccine QUAD 36+ mos IM        Dale Chowan, MD

## 2015-10-07 ENCOUNTER — Ambulatory Visit: Payer: Self-pay | Admitting: Internal Medicine

## 2015-10-08 ENCOUNTER — Encounter: Payer: Self-pay | Admitting: *Deleted

## 2015-10-12 ENCOUNTER — Other Ambulatory Visit (INDEPENDENT_AMBULATORY_CARE_PROVIDER_SITE_OTHER): Payer: PPO

## 2015-10-12 DIAGNOSIS — R319 Hematuria, unspecified: Secondary | ICD-10-CM | POA: Diagnosis not present

## 2015-10-12 DIAGNOSIS — E7800 Pure hypercholesterolemia, unspecified: Secondary | ICD-10-CM

## 2015-10-12 LAB — URINALYSIS, ROUTINE W REFLEX MICROSCOPIC
BILIRUBIN URINE: NEGATIVE
Ketones, ur: NEGATIVE
LEUKOCYTES UA: NEGATIVE
Nitrite: NEGATIVE
PH: 6 (ref 5.0–8.0)
RBC / HPF: NONE SEEN (ref 0–?)
Specific Gravity, Urine: >=1.03 — AB (ref 1.000–1.030)
Total Protein, Urine: NEGATIVE
Urine Glucose: NEGATIVE
Urobilinogen, UA: 0.2 (ref 0.0–1.0)
WBC, UA: NONE SEEN (ref 0–?)

## 2015-10-17 LAB — CULTURE, URINE COMPREHENSIVE

## 2015-10-18 ENCOUNTER — Encounter: Payer: Self-pay | Admitting: Internal Medicine

## 2015-10-18 DIAGNOSIS — Z0000 Encounter for general adult medical examination without abnormal findings: Secondary | ICD-10-CM | POA: Insufficient documentation

## 2015-10-18 NOTE — Assessment & Plan Note (Signed)
Exam as outlined.  Schedule diagnostic mammogram and ultrasound.

## 2015-10-18 NOTE — Assessment & Plan Note (Signed)
Low cholesterol diet and exercise.  Follow lipid panel.   

## 2015-10-18 NOTE — Assessment & Plan Note (Signed)
Physical today 10/06/15.  cologuard as outlined.  Schedule diagnostic mammogram as outlined.

## 2015-10-18 NOTE — Assessment & Plan Note (Signed)
Has dementia.  Sees neurology.  Refer to their last note for details.  On aricept.  Follow.

## 2015-10-18 NOTE — Assessment & Plan Note (Signed)
Has seen Dr Markham Jordan.  Due a f/u colonoscopy.  Discussed today.  Declines referral at this time.  Agreed to cologuard.

## 2015-10-19 ENCOUNTER — Other Ambulatory Visit: Payer: PPO

## 2015-10-19 ENCOUNTER — Ambulatory Visit: Payer: PPO

## 2015-10-23 ENCOUNTER — Encounter: Payer: Self-pay | Admitting: Internal Medicine

## 2015-10-23 NOTE — Telephone Encounter (Signed)
Please call pts daughter and let her know the urine culture revealed very few colonies of bacteria.  Usually do not treat at this level.  If she is not sure about any urinary symptoms, I would like to collect another urine sample and send culture before just starting an abx.  She can pick up urine cup if desires and bring urine in.  Thanks.  Let me know if any problems.

## 2016-04-03 ENCOUNTER — Observation Stay
Admission: EM | Admit: 2016-04-03 | Discharge: 2016-04-04 | Disposition: A | Discharge status: Home / Self Care | Payer: PPO | Attending: Specialist | Admitting: Specialist

## 2016-04-03 ENCOUNTER — Encounter: Payer: Self-pay | Admitting: Emergency Medicine

## 2016-04-03 ENCOUNTER — Emergency Department: Payer: PPO

## 2016-04-03 DIAGNOSIS — Z823 Family history of stroke: Secondary | ICD-10-CM | POA: Insufficient documentation

## 2016-04-03 DIAGNOSIS — K219 Gastro-esophageal reflux disease without esophagitis: Secondary | ICD-10-CM | POA: Diagnosis not present

## 2016-04-03 DIAGNOSIS — I080 Rheumatic disorders of both mitral and aortic valves: Secondary | ICD-10-CM | POA: Insufficient documentation

## 2016-04-03 DIAGNOSIS — G4700 Insomnia, unspecified: Secondary | ICD-10-CM | POA: Diagnosis not present

## 2016-04-03 DIAGNOSIS — H409 Unspecified glaucoma: Secondary | ICD-10-CM | POA: Diagnosis not present

## 2016-04-03 DIAGNOSIS — I4891 Unspecified atrial fibrillation: Secondary | ICD-10-CM | POA: Diagnosis not present

## 2016-04-03 DIAGNOSIS — Z8249 Family history of ischemic heart disease and other diseases of the circulatory system: Secondary | ICD-10-CM | POA: Diagnosis not present

## 2016-04-03 DIAGNOSIS — J9811 Atelectasis: Secondary | ICD-10-CM | POA: Diagnosis not present

## 2016-04-03 DIAGNOSIS — F0390 Unspecified dementia without behavioral disturbance: Secondary | ICD-10-CM | POA: Diagnosis not present

## 2016-04-03 DIAGNOSIS — G3183 Dementia with Lewy bodies: Secondary | ICD-10-CM | POA: Diagnosis not present

## 2016-04-03 DIAGNOSIS — E785 Hyperlipidemia, unspecified: Secondary | ICD-10-CM | POA: Insufficient documentation

## 2016-04-03 DIAGNOSIS — Z86010 Personal history of colonic polyps: Secondary | ICD-10-CM | POA: Insufficient documentation

## 2016-04-03 DIAGNOSIS — F0280 Dementia in other diseases classified elsewhere without behavioral disturbance: Secondary | ICD-10-CM | POA: Insufficient documentation

## 2016-04-03 DIAGNOSIS — Z79899 Other long term (current) drug therapy: Secondary | ICD-10-CM | POA: Insufficient documentation

## 2016-04-03 DIAGNOSIS — R55 Syncope and collapse: Principal | ICD-10-CM | POA: Diagnosis present

## 2016-04-03 DIAGNOSIS — R410 Disorientation, unspecified: Secondary | ICD-10-CM | POA: Diagnosis not present

## 2016-04-03 LAB — COMPREHENSIVE METABOLIC PANEL
ALBUMIN: 4 g/dL (ref 3.5–5.0)
ALT: 16 U/L (ref 14–54)
AST: 29 U/L (ref 15–41)
Alkaline Phosphatase: 60 U/L (ref 38–126)
Anion gap: 10 (ref 5–15)
BUN: 19 mg/dL (ref 6–20)
CHLORIDE: 106 mmol/L (ref 101–111)
CO2: 24 mmol/L (ref 22–32)
CREATININE: 1 mg/dL (ref 0.44–1.00)
Calcium: 9.3 mg/dL (ref 8.9–10.3)
GFR calc Af Amer: >60 mL/min (ref >60)
GFR calc non Af Amer: 52 mL/min — ABNORMAL LOW (ref >60)
Glucose, Bld: 151 mg/dL — ABNORMAL HIGH (ref 65–99)
POTASSIUM: 3.9 mmol/L (ref 3.5–5.1)
SODIUM: 140 mmol/L (ref 135–145)
Total Bilirubin: 0.9 mg/dL (ref 0.3–1.2)
Total Protein: 7.7 g/dL (ref 6.5–8.1)

## 2016-04-03 LAB — CBC WITH DIFFERENTIAL/PLATELET
BASOS ABS: 0.1 10*3/uL (ref 0–0.1)
EOS ABS: 0 10*3/uL (ref 0–0.7)
HCT: 43 % (ref 35.0–47.0)
Hemoglobin: 14.6 g/dL (ref 12.0–16.0)
Lymphs Abs: 0.8 10*3/uL — ABNORMAL LOW (ref 1.0–3.6)
MCH: 30.5 pg (ref 26.0–34.0)
MCHC: 34 g/dL (ref 32.0–36.0)
MCV: 89.6 fL (ref 80.0–100.0)
Monocytes Absolute: 0.3 10*3/uL (ref 0.2–0.9)
Monocytes Relative: 3 %
Neutro Abs: 8.5 10*3/uL — ABNORMAL HIGH (ref 1.4–6.5)
Neutrophils Relative %: 87 %
PLATELETS: 98 10*3/uL — AB (ref 150–440)
RBC: 4.8 MIL/uL (ref 3.80–5.20)
RDW: 14.1 % (ref 11.5–14.5)
WBC: 9.7 10*3/uL (ref 3.6–11.0)

## 2016-04-03 LAB — TROPONIN I: Troponin I: <0.03 ng/mL (ref <0.03)

## 2016-04-03 LAB — GLUCOSE, CAPILLARY: Glucose-Capillary: 131 mg/dL — ABNORMAL HIGH (ref 65–99)

## 2016-04-03 NOTE — ED Triage Notes (Addendum)
Pt arrived via ems from home. Pt was at [employer] where she lost her keys and was given a ride home with a Conservator, museum/gallerydeputy. Once the pt arrived home she had witnessed syncopal episode while on the porch. Pt states that the deputy told her he caught her and assisted her to the ground. Pt doesn't remember the syncopal episode or the way she felt leading up to it. Pt is alert and oriented x 4 and in no apparent distress.

## 2016-04-03 NOTE — H&P (Signed)
[facility] - [place] [facility] Admission History and Physical Tara Elliott, D.O.  ---------------------------------------------------------------------------------------------------------------------   PATIENT NAME: Tara Elliott MR#: [id number] DATE OF BIRTH: May 18, 1934 DATE OF ADMISSION: 04/03/2016 PRIMARY CARE PHYSICIAN: Dale [place], MD  REQUESTING/REFERRING PHYSICIAN: ED Dr. Roxan Hockey  CHIEF COMPLAINT: Chief Complaint  Patient presents with  . Loss of Consciousness    HISTORY OF PRESENT ILLNESS: Tara Elliott is a 82 y.o. female with a known history of The body dementia, hyperlipidemia, GERD was in a usual state of health until this afternoon.  Apparently patient was eating out when she lost her keys. The police officer escorted her home where she had a witnessed syncopal episode as she was walking up the front porch into her home. Patient states that the police officer help support her and assisted her to the ground. The patient denied any complaints preceding this syncopal event. She denies any chest pain shortness of breath, nausea vomiting, dizziness, lightheadedness, palpitations. She denies head trauma. Since the event she's been asymptomatic.  Of note patient states that she has not seen a doctor since she gave birth. Recent records include neurologic evaluation for dementia.  Otherwise there has been no change in status. Patient has been taking medication as prescribed and there has been no recent change in medication or diet.  There has been no recent illness, travel or sick contacts.    Patient denies fevers/chills, weakness, dizziness, chest pain, shortness of breath, N/V/C/D, abdominal pain, dysuria/frequency, changes in mental status.    PAST MEDICAL HISTORY: Past Medical History:  Diagnosis Date  . GERD (gastroesophageal reflux disease)   . Glaucoma   . History of chicken pox   . Hx of colonic polyps     Hyperlipidemia Detached  retina Insomnia Dementia/Louis body dementia  PAST SURGICAL HISTORY: Past Surgical History:  Procedure Laterality Date  . ABDOMINAL HYSTERECTOMY  1964   partial  . APPENDECTOMY  06/01/1954  . EYE SURGERY     several  . TONSILLECTOMY AND ADENOIDECTOMY  05/11/1954  . VEIN LIGATION     left leg      SOCIAL HISTORY: Social History  Substance Use Topics  . Smoking status: Never Smoker  . Smokeless tobacco: Never Used  . Alcohol use No  Patient denies alcohol tobacco or drug use    FAMILY HISTORY: Family History  Problem Relation Age of Onset  . Stroke Mother   . Hypertension Mother    Father with asthma, emphysema and MI  MEDICATIONS AT HOME: Prior to Admission medications   Medication Sig Start Date End Date Taking? Authorizing Provider  donepezil (ARICEPT) 10 MG tablet Take 10 mg by mouth at bedtime.   Yes Historical Provider, MD  vitamin B-12 (CYANOCOBALAMIN) 1000 MCG tablet Take 1,000 mcg by mouth daily.   Yes Historical Provider, MD  hydrocortisone 2.5 % cream Apply topically 2 (two) times daily. Patient not taking: Reported on 04/03/2016 07/11/13   Dale Stillwater, MD      DRUG ALLERGIES: No Known Allergies   REVIEW OF SYSTEMS: CONSTITUTIONAL: No fever/chills, fatigue, weakness, weight gain/loss, headache EYES: No blurry or double vision. ENT: No tinnitus, postnasal drip, redness or soreness of the oropharynx. RESPIRATORY: No cough, wheeze, hemoptysis, dyspnea. CARDIOVASCULAR: No chest pain, orthopnea, palpitations, syncope. GASTROINTESTINAL: No nausea, vomiting, constipation, diarrhea, abdominal pain, hematemesis, melena or hematochezia. GENITOURINARY: No dysuria or hematuria. ENDOCRINE: No polyuria or nocturia. No heat or cold intolerance. HEMATOLOGY: No anemia, bruising, bleeding. INTEGUMENTARY: No rashes, ulcers, lesions. MUSCULOSKELETAL: No arthritis, swelling, gout. NEUROLOGIC:  No numbness, tingling, weakness or ataxia. No seizure-type  activity. PSYCHIATRIC: No anxiety, depression, insomnia.  PHYSICAL EXAMINATION: VITAL SIGNS: Blood pressure 140/83, pulse 68, temperature 97.5 F (36.4 C), temperature source Axillary, resp. rate 20, height 6' (1.829 m), weight 72.6 kg (160 lb), SpO2 100 %.  GENERAL: 82 y.o.-year-old White female patient, well-developed, well-nourished lying in the bed in no acute distress.  Pleasant and cooperative.   HEENT: Head atraumatic, normocephalic. Pupils equal, round, reactive to light and accommodation. No scleral icterus. Extraocular muscles intact. Nares are patent. Oropharynx is clear. Mucus membranes moist. NECK: Supple, full range of motion. No JVD, no bruit heard. No thyroid enlargement, no tenderness, no cervical lymphadenopathy. CHEST: Normal breath sounds bilaterally. No wheezing, rales, rhonchi or crackles. No use of accessory muscles of respiration.  No reproducible chest wall tenderness.  CARDIOVASCULAR: S1, S2 normal. No murmurs, rubs, or gallops. Cap refill <2 seconds. ABDOMEN: Soft, nontender, nondistended. No rebound, guarding, rigidity. Normoactive bowel sounds present in all four quadrants. No organomegaly or mass. EXTREMITIES: Full range of motion. No pedal edema, cyanosis, or clubbing. NEUROLOGIC: Cranial nerves II through XII are grossly intact with no focal sensorimotor deficit. Muscle strength 5/5 in all extremities. Sensation intact. Gait not checked. PSYCHIATRIC: The patient is alert and oriented x 3. Normal affect, mood, thought content. SKIN: Warm, dry, and intact without obvious rash, lesion, or ulcer.  LABORATORY PANEL:  CBC  Recent Labs Lab 04/03/16 2105  WBC 9.7  HGB 14.6  HCT 43.0  PLT 98*   ----------------------------------------------------------------------------------------------------------------- Chemistries  Recent Labs Lab 04/03/16 2105  NA 140  K 3.9  CL 106  CO2 24  GLUCOSE 151*  BUN 19  CREATININE 1.00  CALCIUM 9.3  AST 29  ALT 16   ALKPHOS 60  BILITOT 0.9   ------------------------------------------------------------------------------------------------------------------ Cardiac Enzymes  Recent Labs Lab 04/03/16 2105  TROPONINI <0.03   ------------------------------------------------------------------------------------------------------------------  RADIOLOGY: Dg Chest 2 View  Result Date: 04/03/2016 CLINICAL DATA:  Acute onset of syncope.  Initial encounter. EXAM: CHEST  2 VIEW COMPARISON:  Chest radiograph performed 08/07/2010 FINDINGS: The lungs are well-aerated. Minimal bibasilar atelectasis is noted. There is no evidence of pleural effusion or pneumothorax. The heart is borderline normal in size. No acute osseous abnormalities are seen. IMPRESSION: Minimal bibasilar atelectasis noted. Lungs otherwise clear. No displaced rib fractures identified. Electronically Signed   By: Roanna RaiderJeffery  Chang M.D.   On: 04/03/2016 21:37   Ct Head Wo Contrast  Result Date: 04/03/2016 CLINICAL DATA:  Syncopal episode.  Confusion. EXAM: CT HEAD WITHOUT CONTRAST TECHNIQUE: Contiguous axial images were obtained from the base of the skull through the vertex without intravenous contrast. COMPARISON:  08/29/2013 MRI. FINDINGS: The brain shows generalized atrophy. There are extensive chronic small vessel ischemic changes throughout the cerebral hemispheric white matter. No sign of acute infarction, mass lesion, hemorrhage, hydrocephalus or extra-axial collection. No calvarial abnormality. Sinuses, middle ears and mastoids are clear. IMPRESSION: No identifiable acute finding. Atrophy and extensive chronic small vessel ischemic changes throughout the brain. Electronically Signed   By: Paulina FusiMark  Shogry M.D.   On: 04/03/2016 21:41    EKG: Sinus rhythm at 64 bpm normal axis and no significant ST or T-wave changes  IMPRESSION AND PLAN:  This is a 82 y.o. female with a history of dementia, GERD, hyperlipidemia now being admitted with: 1. Syncope -  workup in the emergency department has been entirely negative. She is currently asymptomatic, hemodynamically stable, labs, imaging and EKG have all been within normal limits. However given  the nature of her complaint, lack of recent workup we will observe her overnight on telemetry. We will obtain serial enzymes, lipid panel and TSH as well as echocardiogram and carotid Dopplers in the morning. We'll continue her regular home medication.  Diet/Nutrition: Heart healthy Fluids: Hep-Lock Home Meds: Continue all DVT Px: Lovenox, SCDs and early ambulation  All the records are reviewed and case discussed with ED provider. Management plans discussed with the patient and/or family who express understanding and agree with plan of care.  CODE STATUS: Full TOTAL TIME TAKING CARE OF THIS PATIENT: 60 minutes.   Trenton Verne D.O. on 04/03/2016 at 11:44 PM Between 7am to 6pm - Pager - [phone] After 6pm go to [email or web address] - Social research officer, government [facility] Office [phone] CC: Primary care physician; Dale Crowley, MD     Note: This dictation was prepared with Dragon dictation along with smaller phrase technology. Any transcriptional errors that result from this process are unintentional.

## 2016-04-03 NOTE — ED Provider Notes (Signed)
[facility] Emergency Department Provider Note    First MD Initiated Contact with Patient 04/03/16 2053     (approximate)  I have reviewed the triage vital signs and the nursing notes.   HISTORY  Chief Complaint Loss of Consciousness    HPI Tara Elliott is a 82 y.o. female who presents after having a witnessed syncopal event by police officer. Patient reported that she was at [employer] day earlier eating and lost her keys. Therefore she called police to get transport back to her home. While walking to the ER the patient had a syncopal event where she fell to the ground and lost consciousness. This was witnessed by the police officer who then called EMS. Upon arrival to the ER the patient denies any symptoms. She does not recall any chest pain or shortness of breath prior to the event. States that she's never lost consciousness before. She denies any previous heart history. Denies any previous history of seizures. No recent surgeries.  On review of her medication she is on Aricept and based on her description of the events today is suspicious that she has some underlying dementia.   Past Medical History:  Diagnosis Date  . GERD (gastroesophageal reflux disease)   . Glaucoma   . History of chicken pox   . Hx of colonic polyps     Patient Active Problem List   Diagnosis Date Noted  . Health care maintenance 10/18/2015  . Fullness of breast 10/06/2015  . Memory change 07/14/2013  . Fall 07/14/2013  . Visual hallucinations 07/14/2013  . Chest pain 07/14/2013  . GERD (gastroesophageal reflux disease) 02/26/2013  . Personal history of colonic polyps 02/26/2013  . Glaucoma 02/26/2013  . Hypercholesterolemia 02/26/2013  . Insomnia 02/26/2013    Past Surgical History:  Procedure Laterality Date  . ABDOMINAL HYSTERECTOMY  1964   partial  . APPENDECTOMY  06/01/1954  . EYE SURGERY     several  . TONSILLECTOMY AND ADENOIDECTOMY  05/11/1954  .  VEIN LIGATION     left leg    Prior to Admission medications   Medication Sig Start Date End Date Taking? Authorizing Provider  Cyanocobalamin (B-12) 3000 MCG CAPS Take by mouth.    Historical Provider, MD  donepezil (ARICEPT) 10 MG tablet Take 10 mg by mouth at bedtime. 09/10/15 12/09/15  Historical Provider, MD  hydrocortisone 2.5 % cream Apply topically 2 (two) times daily. 07/11/13   Dale Durhamharlene Scott, MD  Melatonin-Pyridoxine (MELATONIN/VITAMIN B-6 EX ST) 5-1 MG TABS Take by mouth.    Historical Provider, MD    Allergies Review of patient's allergies indicates no known allergies.  Family History  Problem Relation Age of Onset  . Stroke Mother   . Hypertension Mother     Social History Social History  Substance Use Topics  . Smoking status: Never Smoker  . Smokeless tobacco: Never Used  . Alcohol use No    Review of Systems Patient denies headaches, rhinorrhea, blurry vision, numbness, shortness of breath, chest pain, edema, cough, abdominal pain, nausea, vomiting, diarrhea, dysuria, fevers, rashes or hallucinations unless otherwise stated above in HPI. ____________________________________________   PHYSICAL EXAM:  VITAL SIGNS: Vitals:   04/03/16 2058  BP: 129/88  Pulse: 66  Resp: 19  Temp: 97.5 F (36.4 C)    Constitutional: Alert and oriented. Well appearing and in no acute distress. Eyes: Conjunctivae are normal. PERRL. EOMI. Head: Atraumatic. Nose: No congestion/rhinnorhea. Mouth/Throat: Mucous membranes are moist.  Oropharynx non-erythematous. Neck: No stridor. Painless  ROM. No cervical spine tenderness to palpation Hematological/Lymphatic/Immunilogical: No cervical lymphadenopathy. Cardiovascular: Normal rate, regular rhythm. Grossly normal heart sounds.  Good peripheral circulation. Respiratory: Normal respiratory effort.  No retractions. Lungs CTAB. Gastrointestinal: Soft and nontender. No distention. No abdominal bruits. No CVA  tenderness. Musculoskeletal: No lower extremity tenderness nor edema.  No joint effusions. Neurologic:  Normal speech and language. No gross focal neurologic deficits are appreciated. No gait instability. Skin:  Skin is warm, dry and intact. No rash noted. Psychiatric: Mood and affect are normal. Speech and behavior are normal.  ____________________________________________   LABS (all labs ordered are listed, but only abnormal results are displayed)  Results for orders placed or performed during the hospital encounter of 04/03/16 (from the past 24 hour(s))  Glucose, capillary     Status: Abnormal   Collection Time: 04/03/16  8:59 PM  Result Value Ref Range   Glucose-Capillary 131 (H) 65 - 99 mg/dL   ____________________________________________  EKG My interpretation at Time: 21:02   Indication: syncope  Rate: 64  Rhythm: nsr Axis: normal Other: no acute ischemic changes. ____________________________________________  RADIOLOGY  CT head with NAICA CXR without acute cardiopulmonary process. ____________________________________________   PROCEDURES  Procedure(s) performed: none    Critical Care performed: no ____________________________________________   INITIAL IMPRESSION / ASSESSMENT AND PLAN / ED COURSE  Pertinent labs & imaging results that were available during my care of the patient were reviewed by me and considered in my medical decision making (see chart for details).  DDX: Dysrhythmia, hypoglycemia, subdural, SAH, IPH, seizure,  Tara Elliott is a 82 y.o. who presents to the ED with A witnessed syncopal event today without prodromal symptoms. Patient arrives afebrile and hemodynamically stable. No significant previous cardiac disease documented however given signs syncopal event I am concerned for dysrhythmia or cardiac etiology. Given her fall with head injury will order CT imaging to evaluate for acute traumatic injury. Will check labs for her underlying  joint abnormality as well as EKG and troponin. Will keep patient on monitor.  Clinical Course  Comment By Time  Further review of patient's medical records she does have a history of Lou body dementia. Willy Eddy, MD 08/13 2136  Results reviewed with patient and her son-in-law at bedside. Patient still amnestic to the event. Based on the fact that she lives at home alone and had a witnessed syncopal event without prodromal symptoms that do feel that she requires admission for monitoring with telemetry. She remains seaman dynamically stable at this time.  Spoke with Dr. Emmit Pomfret regarding the patient's story and she is currently agreed to admit patient for further evaluation and management. Willy Eddy, MD 08/13 2322     ____________________________________________   FINAL CLINICAL IMPRESSION(S) / ED DIAGNOSES  Final diagnoses:  Syncope and collapse      NEW MEDICATIONS STARTED DURING THIS VISIT:  New Prescriptions   No medications on file     Note:  This document was prepared using Dragon voice recognition software and may include unintentional dictation errors.    Willy Eddy, MD 04/03/16 [phone]

## 2016-04-03 NOTE — ED Notes (Signed)
MD at bedside. 

## 2016-04-04 ENCOUNTER — Observation Stay: Payer: PPO

## 2016-04-04 ENCOUNTER — Observation Stay
Admit: 2016-04-04 | Discharge: 2016-04-04 | Disposition: A | Discharge status: Home / Self Care | Payer: PPO | Attending: Family Medicine | Admitting: Family Medicine

## 2016-04-04 ENCOUNTER — Encounter: Payer: Self-pay | Admitting: Emergency Medicine

## 2016-04-04 DIAGNOSIS — R55 Syncope and collapse: Secondary | ICD-10-CM | POA: Diagnosis not present

## 2016-04-04 DIAGNOSIS — F0390 Unspecified dementia without behavioral disturbance: Secondary | ICD-10-CM | POA: Diagnosis not present

## 2016-04-04 LAB — CBC
HCT: 38 % (ref 35.0–47.0)
Hemoglobin: 13.2 g/dL (ref 12.0–16.0)
MCH: 30.6 pg (ref 26.0–34.0)
MCHC: 34.7 g/dL (ref 32.0–36.0)
MCV: 88.2 fL (ref 80.0–100.0)
PLATELETS: 147 10*3/uL — AB (ref 150–440)
RBC: 4.31 MIL/uL (ref 3.80–5.20)
RDW: 13.9 % (ref 11.5–14.5)
WBC: 5.7 10*3/uL (ref 3.6–11.0)

## 2016-04-04 LAB — URINALYSIS COMPLETE WITH MICROSCOPIC (ARMC ONLY)
BILIRUBIN URINE: NEGATIVE
Bacteria, UA: NONE SEEN
GLUCOSE, UA: NEGATIVE mg/dL
Ketones, ur: NEGATIVE mg/dL
Leukocytes, UA: NEGATIVE
NITRITE: NEGATIVE
Protein, ur: NEGATIVE mg/dL
SPECIFIC GRAVITY, URINE: 1.015 (ref 1.005–1.030)
pH: 5 (ref 5.0–8.0)

## 2016-04-04 LAB — LIPID PANEL
CHOL/HDL RATIO: 4.3 ratio
CHOLESTEROL: 221 mg/dL — AB (ref 0–200)
HDL: 51 mg/dL (ref >40)
LDL Cholesterol: 152 mg/dL — ABNORMAL HIGH (ref 0–99)
Triglycerides: 90 mg/dL (ref <150)
VLDL: 18 mg/dL (ref 0–40)

## 2016-04-04 LAB — BASIC METABOLIC PANEL
ANION GAP: 7 (ref 5–15)
BUN: 21 mg/dL — ABNORMAL HIGH (ref 6–20)
CO2: 25 mmol/L (ref 22–32)
Calcium: 9 mg/dL (ref 8.9–10.3)
Chloride: 108 mmol/L (ref 101–111)
Creatinine, Ser: 0.97 mg/dL (ref 0.44–1.00)
GFR calc Af Amer: >60 mL/min (ref >60)
GFR calc non Af Amer: 54 mL/min — ABNORMAL LOW (ref >60)
GLUCOSE: 111 mg/dL — AB (ref 65–99)
POTASSIUM: 4 mmol/L (ref 3.5–5.1)
Sodium: 140 mmol/L (ref 135–145)

## 2016-04-04 LAB — TROPONIN I: Troponin I: <0.03 ng/mL (ref <0.03)

## 2016-04-04 LAB — MAGNESIUM: MAGNESIUM: 2.2 mg/dL (ref 1.7–2.4)

## 2016-04-04 MED ORDER — DONEPEZIL HCL 5 MG PO TABS: 10.0000 mg | ORAL_TABLET | Freq: Every day | ORAL | Status: DC

## 2016-04-04 MED ORDER — ACETAMINOPHEN 650 MG RE SUPP: 650.0000 mg | Freq: Four times a day (QID) | RECTAL | Status: DC | PRN

## 2016-04-04 MED ORDER — PNEUMOCOCCAL VAC POLYVALENT 25 MCG/0.5ML IJ INJ: 0.5000 mL | INJECTION | INTRAMUSCULAR | Status: DC

## 2016-04-04 MED ORDER — ENOXAPARIN SODIUM 40 MG/0.4ML ~~LOC~~ SOLN: 40.0000 mg | SUBCUTANEOUS | Status: DC

## 2016-04-04 MED ORDER — ONDANSETRON HCL 4 MG PO TABS: 4.0000 mg | ORAL_TABLET | Freq: Four times a day (QID) | ORAL | Status: DC | PRN

## 2016-04-04 MED ORDER — VITAMIN B-12 1000 MCG PO TABS
1000.0000 ug | ORAL_TABLET | Freq: Every day | ORAL | Status: DC
  Administered 2016-04-04: 1000 ug via ORAL
  Filled 2016-04-04: qty 1

## 2016-04-04 MED ORDER — SODIUM CHLORIDE 0.9% FLUSH
3.0000 mL | Freq: Two times a day (BID) | INTRAVENOUS | Status: DC
  Administered 2016-04-04 (×2): 3 mL via INTRAVENOUS

## 2016-04-04 MED ORDER — ACETAMINOPHEN 325 MG PO TABS: 650.0000 mg | ORAL_TABLET | Freq: Four times a day (QID) | ORAL | Status: DC | PRN

## 2016-04-04 MED ORDER — ONDANSETRON HCL 4 MG/2ML IJ SOLN: 4.0000 mg | Freq: Four times a day (QID) | INTRAMUSCULAR | Status: DC | PRN

## 2016-04-04 NOTE — Progress Notes (Signed)
Tara Elliott is a 82 y.o. female  [id number]  Primary Cardiologist: Adrian Blackwater Reason for Consultation: Syncope  HPI: This is a 81 year old white female with a past medical history of dementia hyperlipidemia acid reflux presented to the hospital after an episode where she felt weak and apparently passed out. He stays of she was in the lawn and apparently passed out and neighbors came and the brought her to the hospital.   Review of Systems: No chest pain orthopnea PND or leg swelling    Past Medical History:  Diagnosis Date  . GERD (gastroesophageal reflux disease)   . Glaucoma   . History of chicken pox   . Hx of colonic polyps     Medications Prior to Admission  Medication Sig Dispense Refill  . donepezil (ARICEPT) 10 MG tablet Take 10 mg by mouth at bedtime.    . vitamin B-12 (CYANOCOBALAMIN) 1000 MCG tablet Take 1,000 mcg by mouth daily.    . hydrocortisone 2.5 % cream Apply topically 2 (two) times daily. (Patient not taking: Reported on 04/03/2016) 30 g 0     . donepezil  10 mg Oral QHS  . enoxaparin (LOVENOX) injection  40 mg Subcutaneous Q24H  . [START ON 04/05/2016] pneumococcal 23 valent vaccine  0.5 mL Intramuscular Tomorrow-1000  . sodium chloride flush  3 mL Intravenous Q12H  . vitamin B-12  1,000 mcg Oral Daily    Infusions:    No Known Allergies  Social History   Social History  . Marital status: Widowed    Spouse name: N/A  . Number of children: 1  . Years of education: N/A   Occupational History  . Not on file.   Social History Main Topics  . Smoking status: Never Smoker  . Smokeless tobacco: Never Used  . Alcohol use No  . Drug use: No  . Sexual activity: Not on file   Other Topics Concern  . Not on file   Social History Narrative  . No narrative on file    Family History  Problem Relation Age of Onset  . Stroke Mother   . Hypertension Mother     PHYSICAL EXAM: Vitals:   04/04/16 0446 04/04/16 0448  BP: (!) 153/82 (!)  143/79  Pulse: 80 91  Resp:    Temp:       Intake/Output Summary (Last 24 hours) at 04/04/16 0853 Last data filed at 04/04/16 0017  Gross per 24 hour  Intake                0 ml  Output              200 ml  Net             -200 ml    General:  Well appearing. No respiratory difficulty HEENT: normal Neck: supple. no JVD. Carotids 2+ bilat; no bruits. No lymphadenopathy or thryomegaly appreciated. Cor: PMI nondisplaced. Regular rate & rhythm. No rubs, gallops or murmurs. Lungs: clear Abdomen: soft, nontender, nondistended. No hepatosplenomegaly. No bruits or masses. Good bowel sounds. Extremities: no cyanosis, clubbing, rash, edema Neuro: alert & oriented x 3, cranial nerves grossly intact. moves all 4 extremities w/o difficulty. Affect pleasant.  YQM:VHQIO rhythm nonspecific ST-T changes  Results for orders placed or performed during the hospital encounter of 04/03/16 (from the past 24 hour(s))  Glucose, capillary     Status: Abnormal   Collection Time: 04/03/16  8:59 PM  Result Value Ref Range   Glucose-Capillary  131 (H) 65 - 99 mg/dL  CBC with Differential/Platelet     Status: Abnormal   Collection Time: 04/03/16  9:05 PM  Result Value Ref Range   WBC 9.7 3.6 - 11.0 K/uL   RBC 4.80 3.80 - 5.20 MIL/uL   Hemoglobin 14.6 12.0 - 16.0 g/dL   HCT 16.143.0 09.635.0 - 04.547.0 %   MCV 89.6 80.0 - 100.0 fL   MCH 30.5 26.0 - 34.0 pg   MCHC 34.0 32.0 - 36.0 g/dL   RDW 40.914.1 81.111.5 - 91.414.5 %   Platelets 98 (L) 150 - 440 K/uL   Neutrophils Relative % 87% %   Neutro Abs 8.5 (H) 1.4 - 6.5 K/uL   Lymphocytes Relative 9% %   Lymphs Abs 0.8 (L) 1.0 - 3.6 K/uL   Monocytes Relative 3% %   Monocytes Absolute 0.3 0.2 - 0.9 K/uL   Eosinophils Relative 0% %   Eosinophils Absolute 0.0 0 - 0.7 K/uL   Basophils Relative 1% %   Basophils Absolute 0.1 0 - 0.1 K/uL  Comprehensive metabolic panel     Status: Abnormal   Collection Time: 04/03/16  9:05 PM  Result Value Ref Range   Sodium 140 135 - 145 mmol/L    Potassium 3.9 3.5 - 5.1 mmol/L   Chloride 106 101 - 111 mmol/L   CO2 24 22 - 32 mmol/L   Glucose, Bld 151 (H) 65 - 99 mg/dL   BUN 19 6 - 20 mg/dL   Creatinine, Ser 7.821.00 0.44 - 1.00 mg/dL   Calcium 9.3 8.9 - 95.610.3 mg/dL   Total Protein 7.7 6.5 - 8.1 g/dL   Albumin 4.0 3.5 - 5.0 g/dL   AST 29 15 - 41 U/L   ALT 16 14 - 54 U/L   Alkaline Phosphatase 60 38 - 126 U/L   Total Bilirubin 0.9 0.3 - 1.2 mg/dL   GFR calc non Af Amer 52 (L) >60 mL/min   GFR calc Af Amer >60 >60 mL/min   Anion gap 10 5 - 15  Troponin I     Status: None   Collection Time: 04/03/16  9:05 PM  Result Value Ref Range   Troponin I <0.03 <0.03 ng/mL  Magnesium     Status: None   Collection Time: 04/03/16  9:05 PM  Result Value Ref Range   Magnesium 2.2 1.7 - 2.4 mg/dL  Urinalysis complete, with microscopic ([facility] only)     Status: Abnormal   Collection Time: 04/03/16 11:54 PM  Result Value Ref Range   Color, Urine YELLOW (A) YELLOW   APPearance CLEAR (A) CLEAR   Glucose, UA NEGATIVE NEGATIVE mg/dL   Bilirubin Urine NEGATIVE NEGATIVE   Ketones, ur NEGATIVE NEGATIVE mg/dL   Specific Gravity, Urine 1.015 1.005 - 1.030   Hgb urine dipstick 2+ (A) NEGATIVE   pH 5.0 5.0 - 8.0   Protein, ur NEGATIVE NEGATIVE mg/dL   Nitrite NEGATIVE NEGATIVE   Leukocytes, UA NEGATIVE NEGATIVE   RBC / HPF 0-5 0 - 5 RBC/hpf   WBC, UA 0-5 0 - 5 WBC/hpf   Bacteria, UA NONE SEEN NONE SEEN   Squamous Epithelial / LPF 0-5 (A) NONE SEEN  Basic metabolic panel     Status: Abnormal   Collection Time: 04/04/16  3:10 AM  Result Value Ref Range   Sodium 140 135 - 145 mmol/L   Potassium 4.0 3.5 - 5.1 mmol/L   Chloride 108 101 - 111 mmol/L   CO2 25 22 - 32 mmol/L  Glucose, Bld 111 (H) 65 - 99 mg/dL   BUN 21 (H) 6 - 20 mg/dL   Creatinine, Ser 1.610.97 0.44 - 1.00 mg/dL   Calcium 9.0 8.9 - 09.610.3 mg/dL   GFR calc non Af Amer 54 (L) >60 mL/min   GFR calc Af Amer >60 >60 mL/min   Anion gap 7 5 - 15  CBC     Status: Abnormal   Collection Time:  04/04/16  3:10 AM  Result Value Ref Range   WBC 5.7 3.6 - 11.0 K/uL   RBC 4.31 3.80 - 5.20 MIL/uL   Hemoglobin 13.2 12.0 - 16.0 g/dL   HCT 04.538.0 40.935.0 - 81.147.0 %   MCV 88.2 80.0 - 100.0 fL   MCH 30.6 26.0 - 34.0 pg   MCHC 34.7 32.0 - 36.0 g/dL   RDW 91.413.9 78.211.5 - 95.614.5 %   Platelets 147 (L) 150 - 440 K/uL  Lipid panel     Status: Abnormal   Collection Time: 04/04/16  3:10 AM  Result Value Ref Range   Cholesterol 221 (H) 0 - 200 mg/dL   Triglycerides 90 <213<150 mg/dL   HDL 51 >08>40 mg/dL   Total CHOL/HDL Ratio 4.3 RATIO   VLDL 18 0 - 40 mg/dL   LDL Cholesterol 657152 (H) 0 - 99 mg/dL  Troponin I (q 6hr x 3)     Status: None   Collection Time: 04/04/16  3:10 AM  Result Value Ref Range   Troponin I <0.03 <0.03 ng/mL   Dg Chest 2 View  Result Date: 04/03/2016 CLINICAL DATA:  Acute onset of syncope.  Initial encounter. EXAM: CHEST  2 VIEW COMPARISON:  Chest radiograph performed 08/07/2010 FINDINGS: The lungs are well-aerated. Minimal bibasilar atelectasis is noted. There is no evidence of pleural effusion or pneumothorax. The heart is borderline normal in size. No acute osseous abnormalities are seen. IMPRESSION: Minimal bibasilar atelectasis noted. Lungs otherwise clear. No displaced rib fractures identified. Electronically Signed   By: Roanna RaiderJeffery  Chang M.D.   On: 04/03/2016 21:37   Ct Head Wo Contrast  Result Date: 04/03/2016 CLINICAL DATA:  Syncopal episode.  Confusion. EXAM: CT HEAD WITHOUT CONTRAST TECHNIQUE: Contiguous axial images were obtained from the base of the skull through the vertex without intravenous contrast. COMPARISON:  08/29/2013 MRI. FINDINGS: The brain shows generalized atrophy. There are extensive chronic small vessel ischemic changes throughout the cerebral hemispheric white matter. No sign of acute infarction, mass lesion, hemorrhage, hydrocephalus or extra-axial collection. No calvarial abnormality. Sinuses, middle ears and mastoids are clear. IMPRESSION: No identifiable acute  finding. Atrophy and extensive chronic small vessel ischemic changes throughout the brain. Electronically Signed   By: Paulina FusiMark  Shogry M.D.   On: 04/03/2016 21:41     ASSESSMENT AND PLAN:Syncopal episode with no chest pain and no prior history of coronary artery disease or MI. Advise getting carotid Doppler if they're negative can be sent home and follow-up in the office this Thursday at 2 PM.  Xariah Silvernail A

## 2016-04-04 NOTE — ED Notes (Signed)
Patient's son in law is the contact person and can be reached at [phone] ([name]).  Patients daughter is Tara Elliott and can be reached at [phone]

## 2016-04-04 NOTE — Care Management (Signed)
Independent in all adls, denies issues accessing medical care, obtaining medications or with transportation.  Current with her PCP.  No discharge needs identified at present by care manager or members of care team.  Placed in observation after a witnessed syncope event.  Work up at present is negative

## 2016-04-04 NOTE — Progress Notes (Signed)
*  PRELIMINARY RESULTS* Echocardiogram 2D Echocardiogram has been performed.  Cristela BlueHege, Izaac Reisig 04/04/2016, 2:46 PM

## 2016-04-04 NOTE — Discharge Summary (Signed)
[facility] [place] [facility]   PATIENT NAME: Tara Elliott    MR#:  [id number]  DATE OF BIRTH:  Jan 09, 1934  DATE OF ADMISSION:  04/03/2016 ADMITTING PHYSICIAN: Enid Baasadhika Kalisetti, MD  DATE OF DISCHARGE: 04/04/2016  PRIMARY CARE PHYSICIAN: Dale DurhamSCOTT, CHARLENE, MD    ADMISSION DIAGNOSIS:  Syncope and collapse [R55]  DISCHARGE DIAGNOSIS:  Active Problems:   Syncope   SECONDARY DIAGNOSIS:   Past Medical History:  Diagnosis Date  . GERD (gastroesophageal reflux disease)   . Glaucoma   . History of chicken pox   . Hx of colonic polyps     HOSPITAL COURSE:   82 year old female with past medical history of GERD, glaucoma, dementia presents to the hospital with a syncopal episode.  1. Syncope-this is probably vasovagal in nature. Patient was observed overnight in the hospital on telemetry had no evidence of arrhythmia, cardiac markers 3 have been negative. Her CT head was negative, a carotid duplex shows no hemodynamically significant stenosis. -She has been seen by cardiology and they do not recommend any further intervention and she will follow-up with them as an outpatient. If patient has another episode of syncope she would benefit from a event/Loop monitor.  2. Dementia-patient will resume her Aricept.  DISCHARGE CONDITIONS:   Stable  CONSULTS OBTAINED:  Treatment Team:  Laurier NancyShaukat A Khan, MD  DRUG ALLERGIES:  No Known Allergies  DISCHARGE MEDICATIONS:     Medication List    TAKE these medications   donepezil 10 MG tablet Commonly known as:  ARICEPT Take 10 mg by mouth at bedtime.   hydrocortisone 2.5 % cream Apply topically 2 (two) times daily.   vitamin B-12 1000 MCG tablet Commonly known as:  CYANOCOBALAMIN Take 1,000 mcg by mouth daily.         DISCHARGE INSTRUCTIONS:   DIET:  Regular diet  DISCHARGE CONDITION:  Stable  ACTIVITY:  Activity as tolerated  OXYGEN:  Home Oxygen: No.   Oxygen Delivery: room  air  DISCHARGE LOCATION:  home   If you experience worsening of your admission symptoms, develop shortness of breath, life threatening emergency, suicidal or homicidal thoughts you must seek medical attention immediately by calling 911 or calling your MD immediately  if symptoms less severe.  You Must read complete instructions/literature along with all the possible adverse reactions/side effects for all the Medicines you take and that have been prescribed to you. Take any new Medicines after you have completely understood and accpet all the possible adverse reactions/side effects.   Please note  You were cared for by a hospitalist during your hospital stay. If you have any questions about your discharge medications or the care you received while you were in the hospital after you are discharged, you can call the unit and asked to speak with the hospitalist on call if the hospitalist that took care of you is not available. Once you are discharged, your primary care physician will handle any further medical issues. Please note that NO REFILLS for any discharge medications will be authorized once you are discharged, as it is imperative that you return to your primary care physician (or establish a relationship with a primary care physician if you do not have one) for your aftercare needs so that they can reassess your need for medications and monitor your lab values.     Today   No arrhythmia on telemetry, no acute events overnight. Asymptomatic presently.  VITAL SIGNS:  Blood pressure (!) 141/58, pulse 66, temperature 98.1  F (36.7 C), temperature source Oral, resp. rate 18, height 6' (1.829 m), weight 71.7 kg (158 lb), SpO2 96 %.  I/O:   Intake/Output Summary (Last 24 hours) at 04/04/16 1546 Last data filed at 04/04/16 1054  Gross per 24 hour  Intake                0 ml  Output              400 ml  Net             -400 ml    PHYSICAL EXAMINATION:  GENERAL:  82 y.o.-year-old patient  lying in the bed with no acute distress.  EYES: Pupils equal, round, reactive to light and accommodation. No scleral icterus. Extraocular muscles intact.  HEENT: Head atraumatic, normocephalic. Oropharynx and nasopharynx clear.  NECK:  Supple, no jugular venous distention. No thyroid enlargement, no tenderness.  LUNGS: Normal breath sounds bilaterally, no wheezing, rales,rhonchi. No use of accessory muscles of respiration.  CARDIOVASCULAR: S1, S2 normal. No murmurs, rubs, or gallops.  ABDOMEN: Soft, non-tender, non-distended. Bowel sounds present. No organomegaly or mass.  EXTREMITIES: No pedal edema, cyanosis, or clubbing.  NEUROLOGIC: Cranial nerves II through XII are intact. No focal motor or sensory defecits b/l.  PSYCHIATRIC: The patient is alert and oriented x 3. Good affect.  SKIN: No obvious rash, lesion, or ulcer.   DATA REVIEW:   CBC  Recent Labs Lab 04/04/16 0310  WBC 5.7  HGB 13.2  HCT 38.0  PLT 147*    Chemistries   Recent Labs Lab 04/03/16 2105 04/04/16 0310  NA 140 140  K 3.9 4.0  CL 106 108  CO2 24 25  GLUCOSE 151* 111*  BUN 19 21*  CREATININE 1.00 0.97  CALCIUM 9.3 9.0  MG 2.2  --   AST 29  --   ALT 16  --   ALKPHOS 60  --   BILITOT 0.9  --     Cardiac Enzymes  Recent Labs Lab 04/04/16 1506  TROPONINI <0.03     RADIOLOGY:  Dg Chest 2 View  Result Date: 04/03/2016 CLINICAL DATA:  Acute onset of syncope.  Initial encounter. EXAM: CHEST  2 VIEW COMPARISON:  Chest radiograph performed 08/07/2010 FINDINGS: The lungs are well-aerated. Minimal bibasilar atelectasis is noted. There is no evidence of pleural effusion or pneumothorax. The heart is borderline normal in size. No acute osseous abnormalities are seen. IMPRESSION: Minimal bibasilar atelectasis noted. Lungs otherwise clear. No displaced rib fractures identified. Electronically Signed   By: Roanna Raider M.D.   On: 04/03/2016 21:37   Ct Head Wo Contrast  Result Date: 04/03/2016 CLINICAL  DATA:  Syncopal episode.  Confusion. EXAM: CT HEAD WITHOUT CONTRAST TECHNIQUE: Contiguous axial images were obtained from the base of the skull through the vertex without intravenous contrast. COMPARISON:  08/29/2013 MRI. FINDINGS: The brain shows generalized atrophy. There are extensive chronic small vessel ischemic changes throughout the cerebral hemispheric white matter. No sign of acute infarction, mass lesion, hemorrhage, hydrocephalus or extra-axial collection. No calvarial abnormality. Sinuses, middle ears and mastoids are clear. IMPRESSION: No identifiable acute finding. Atrophy and extensive chronic small vessel ischemic changes throughout the brain. Electronically Signed   By: Paulina Fusi M.D.   On: 04/03/2016 21:41   US Carotid Bilateral  Result Date: 04/04/2016 CLINICAL DATA:  Syncope. EXAM: BILATERAL CAROTID DUPLEX ULTRASOUND TECHNIQUE: Wallace Cullens scale imaging, color Doppler and duplex ultrasound were performed of bilateral carotid and vertebral arteries in the neck. COMPARISON:  CT 04/03/2016 . FINDINGS: Criteria: Quantification of carotid stenosis is based on velocity parameters that correlate the residual internal carotid diameter with NASCET-based stenosis levels, using the diameter of the distal internal carotid lumen as the denominator for stenosis measurement. The following velocity measurements were obtained: RIGHT ICA:  85/20 cm/sec CCA:  82/11 cm/sec SYSTOLIC ICA/CCA RATIO:  1.2 DIASTOLIC ICA/CCA RATIO:  1.8 ECA:  58 cm/sec LEFT ICA:  96/25 cm/sec CCA:  63/11 cm/sec SYSTOLIC ICA/CCA RATIO:  1.5 DIASTOLIC ICA/CCA RATIO:  2.4 ECA:  124 cm/sec RIGHT CAROTID ARTERY: No significant carotid atherosclerotic vascular disease. RIGHT VERTEBRAL ARTERY:  Patent with antegrade flow. LEFT CAROTID ARTERY: No significant carotid atherosclerotic vascular disease. LEFT VERTEBRAL ARTERY:  Patent antegrade flow . IMPRESSION: 1.  No significant carotid atherosclerotic vascular disease. 2.  Vertebral arteries are  patent with antegrade flow. Electronically Signed   By: Maisie Fushomas  Register   On: 04/04/2016 13:00      Management plans discussed with the patient, family and they are in agreement.  CODE STATUS:     Code Status Orders        Start     Ordered   04/04/16 0046  Full code  Continuous     04/04/16 0045    Code Status History    Date Active Date Inactive Code Status Order ID Comments User Context   04/04/2016 12:45 AM 04/04/2016 12:35 PM Full Code [id number]  Tonye RoyaltyAlexis Hugelmeyer, DO Inpatient      TOTAL TIME TAKING CARE OF THIS PATIENT: 40 minutes.    Houston SirenSAINANI,VIVEK J M.D on 04/04/2016 at 3:46 PM  Between 7am to 6pm - Pager - [phone]  After 6pm go to [email or web address] - password [email or web address] [facility]  [facility]  Office  [phone]  CC: Primary care physician; Dale DurhamSCOTT, CHARLENE, MD

## 2016-04-04 NOTE — Progress Notes (Signed)
Patient admission information and plan of care explained. Verbalized understanding. Patient telemetry box verified with Susa RaringLisa RN. Patient placed as High falls risk. Verbalized understanding of how to call for help. Patient seems forgetful and has poor concentration at times. Will continue to monitor

## 2016-04-04 NOTE — Care Management Obs Status (Signed)
MEDICARE OBSERVATION STATUS NOTIFICATION   Patient Details  Name: Tara Elliott MRN: [id number] Date of Birth: [birth year]   Medicare Observation Status Notification Given:  Yes    Eber HongGreene, Lenox Bink R, RN 04/04/2016, 9:13 AM

## 2016-04-05 ENCOUNTER — Telehealth: Payer: Self-pay

## 2016-04-05 LAB — ECHOCARDIOGRAM COMPLETE
Height: 72 in
Weight: 2528 oz

## 2016-04-05 NOTE — Telephone Encounter (Signed)
Called to follow up with transitional care management.  Spoke with daughter Sue Lush(Andrea) HIPP complaint.  She reports patient is doing well and would not like to schedule a hospital follow up at this time.  Encouraged to call the office if condition worsens and as needed.

## 2016-11-03 ENCOUNTER — Telehealth: Payer: Self-pay | Admitting: Internal Medicine

## 2016-11-03 NOTE — Telephone Encounter (Signed)
Left pt message asking to call Allison back directly at [phone] to schedule AWV. Thanks! °

## 2016-11-14 NOTE — Telephone Encounter (Signed)
LVM for pt daughter asking to call Revonda Standardllison back directly to schedule AWV

## 2017-02-24 ENCOUNTER — Telehealth: Payer: Self-pay | Admitting: Internal Medicine

## 2017-02-24 NOTE — Telephone Encounter (Signed)
Third call to pt's daughter Tara Elliott regarding AWV. Lvm for Tara Elliott to call office [phone]) to schedule appt.

## 2017-04-05 ENCOUNTER — Encounter: Payer: Self-pay | Admitting: Internal Medicine

## 2017-04-07 ENCOUNTER — Ambulatory Visit: Payer: PPO | Admitting: Internal Medicine

## 2017-04-22 DEATH — deceased

## 2017-05-10 ENCOUNTER — Telehealth: Payer: Self-pay | Admitting: Internal Medicine

## 2017-05-10 NOTE — Telephone Encounter (Signed)
FYI - Pt daughter called and stated that patient passed away on 04/16/2017.

## 2017-05-10 NOTE — Telephone Encounter (Signed)
Called and spoke with daughter Sue Lush).  Will let me know if needs anything.

## 2017-05-10 NOTE — Telephone Encounter (Signed)
Noted, forwarded to PCP

## 2017-11-19 IMAGING — CT CT HEAD W/O CM
3 series · 15 of 46 positions shown, 18 images · non-contrast
Comparison: 08/29/2013 MRI.

CLINICAL DATA: Syncopal episode.  Confusion.

EXAM:
CT HEAD WITHOUT CONTRAST
TECHNIQUE: Contiguous axial images were obtained from the base of the skull
through the vertex without intravenous contrast.

[Series 2: head wo · axial · 0.45mm/px · z∈[-132,-12]mm · 9 of 29 slices shown, 12 images]
[im 3/29  brain]
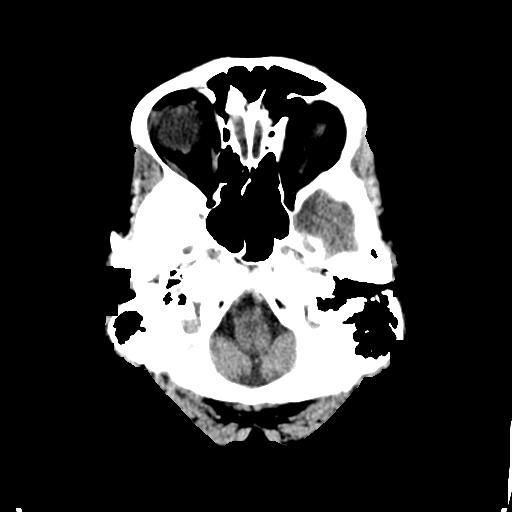
[im 3/29  bone]
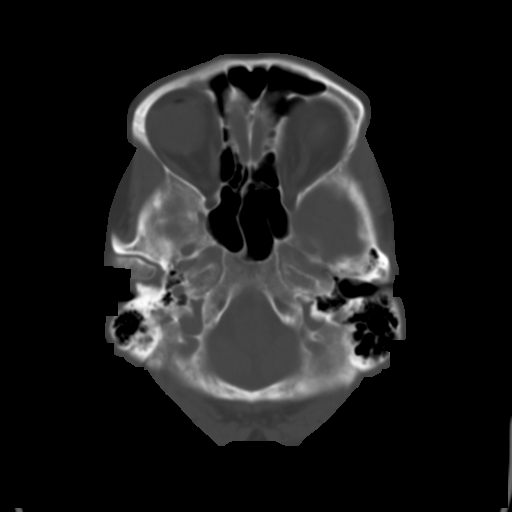
[im 6/29  brain]
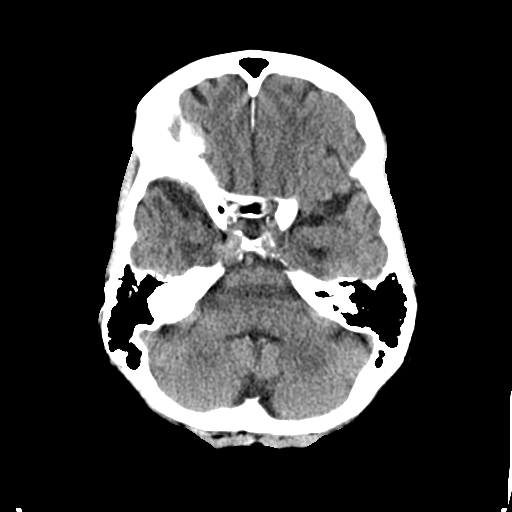
[im 9/29  brain]
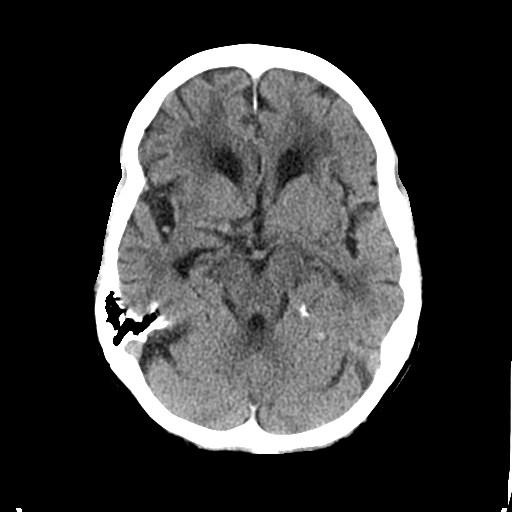
[im 12/29  brain]
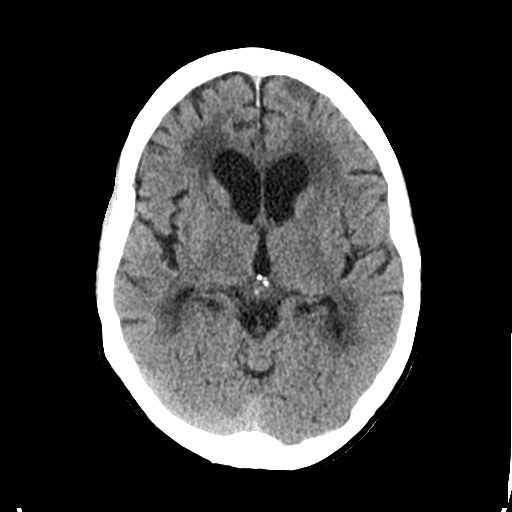
[im 15/29  brain]
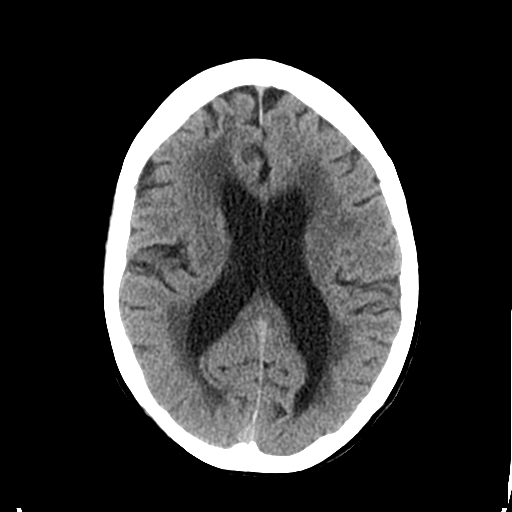
[im 15/29  bone]
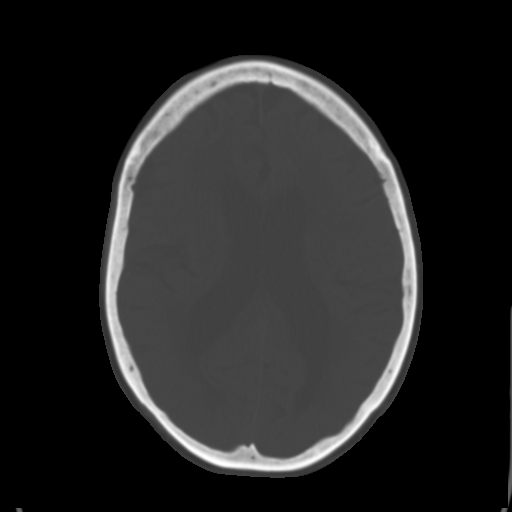
[im 18/29  brain]
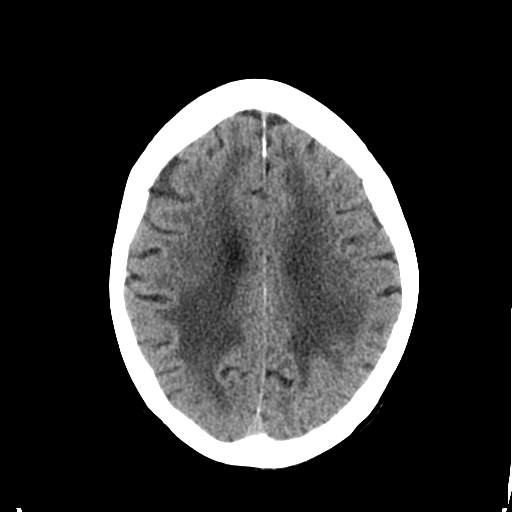
[im 21/29  brain]
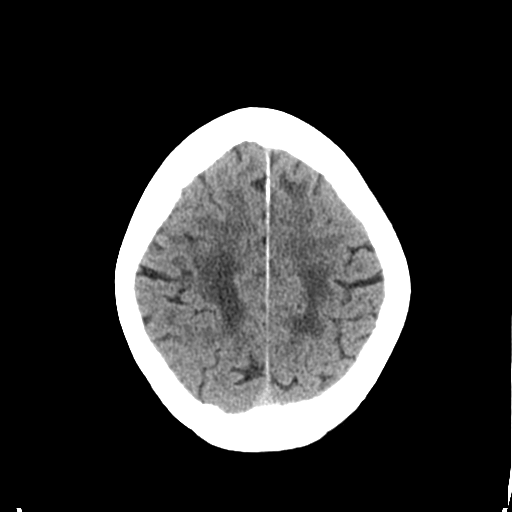
[im 24/29  brain]
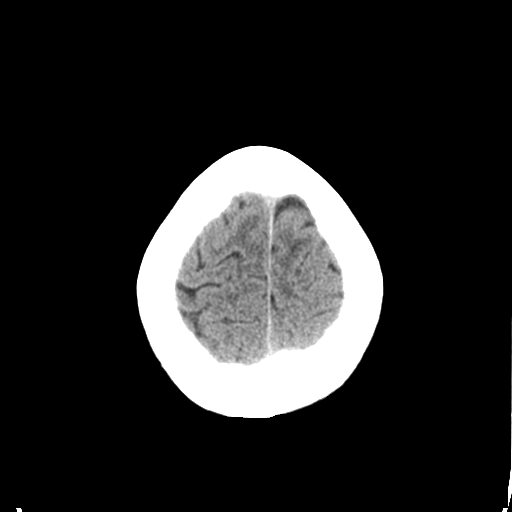
[im 27/29  brain]
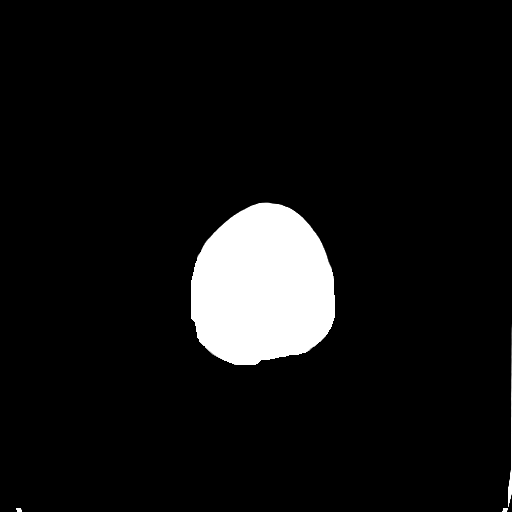
[im 27/29  bone]
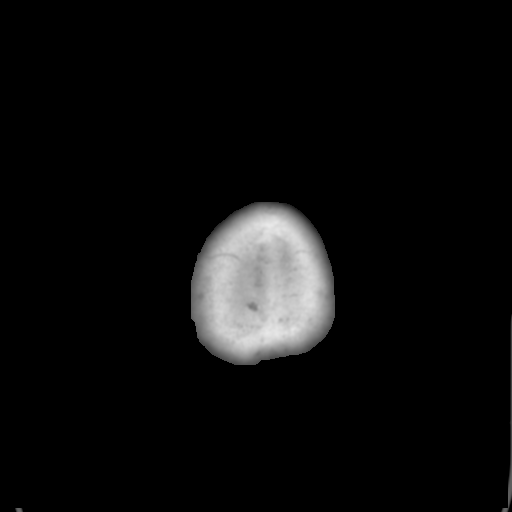

[Series 4: coronal soft tissue · coronal · 0.28mm/px · 3 of 62 slices shown]
[im 21/62  brain]
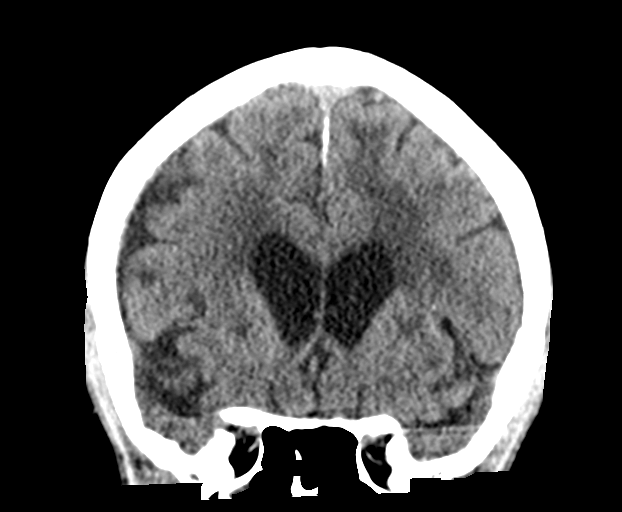
[im 28/62  brain]
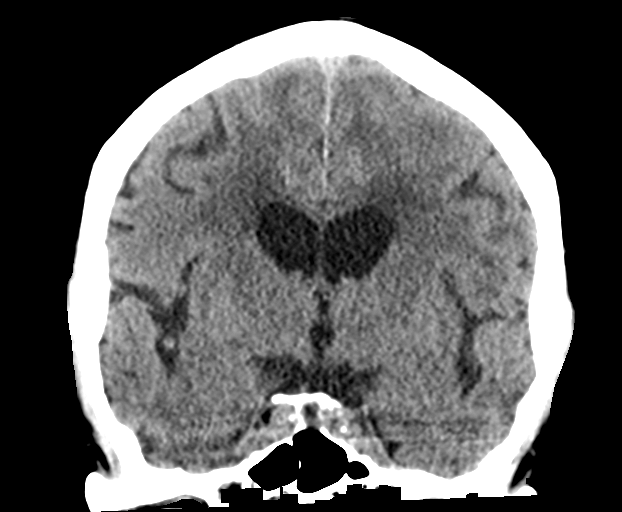
[im 34/62  brain]
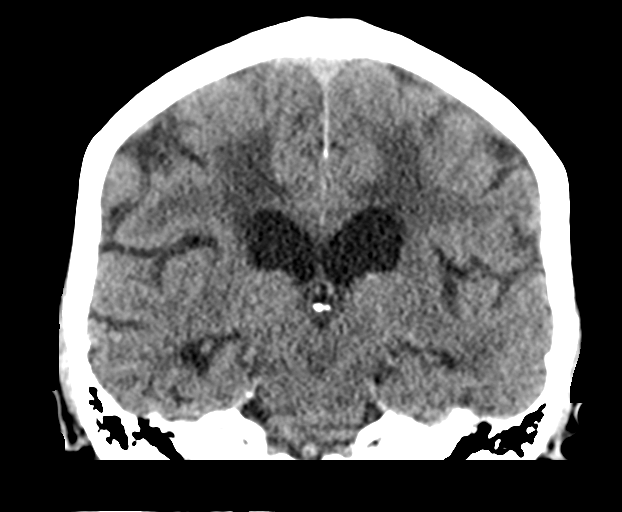

[Series 5: sagittal soft tissue · sagittal · 0.30mm/px · 3 of 47 slices shown]
[im 16/47  brain]
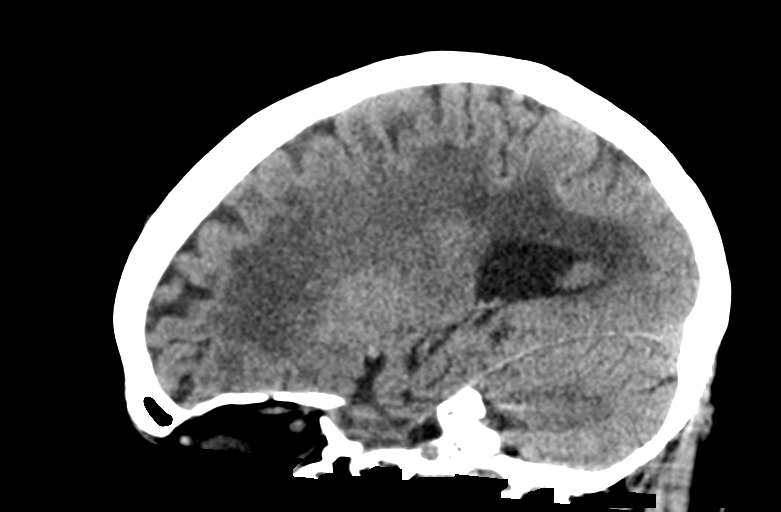
[im 24/47  brain]
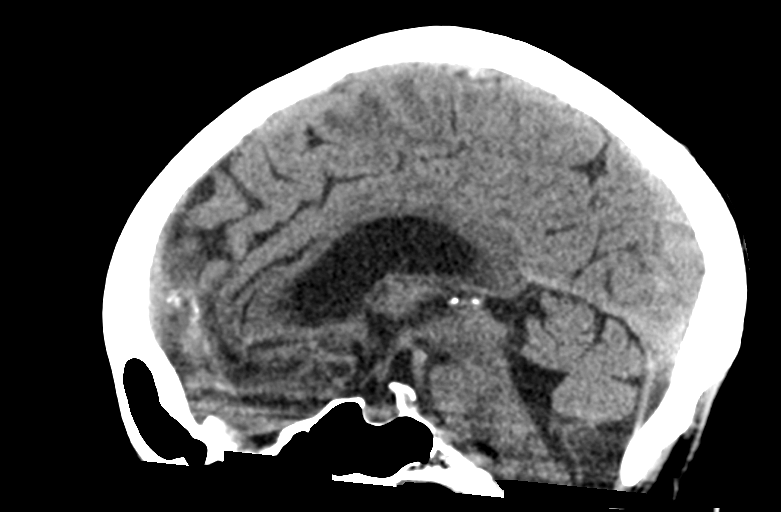
[im 31/47  brain]
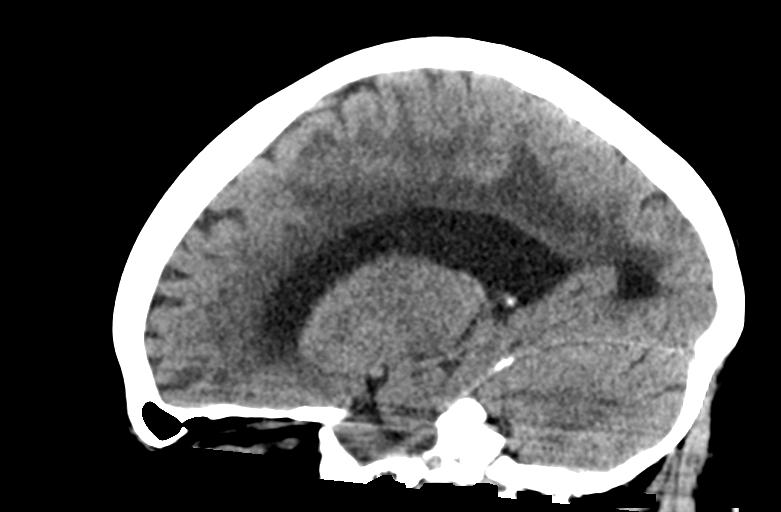

[15 of 46 positions shown; findings below may reference images not displayed]

FINDINGS: The brain shows generalized atrophy. There are extensive chronic
small vessel ischemic changes throughout the cerebral hemispheric
white matter. No sign of acute infarction, mass lesion, hemorrhage,
hydrocephalus or extra-axial collection. No calvarial abnormality.
Sinuses, middle ears and mastoids are clear.
IMPRESSION: No identifiable acute finding. Atrophy and extensive chronic small
vessel ischemic changes throughout the brain.

## 2018-06-22 IMAGING — US US CAROTID DUPLEX BILAT
1 series · 13 of 24 positions shown · non-contrast
Comparison: CT 04/03/2016 .

CLINICAL DATA: Syncope.

EXAM:
BILATERAL CAROTID DUPLEX ULTRASOUND
TECHNIQUE: Gray scale imaging, color Doppler and duplex ultrasound were
performed of bilateral carotid and vertebral arteries in the neck.

[Series 1: us carotid duplex bilat · 0.06mm/px · 13 of 69 slices shown]
[im 1/69]
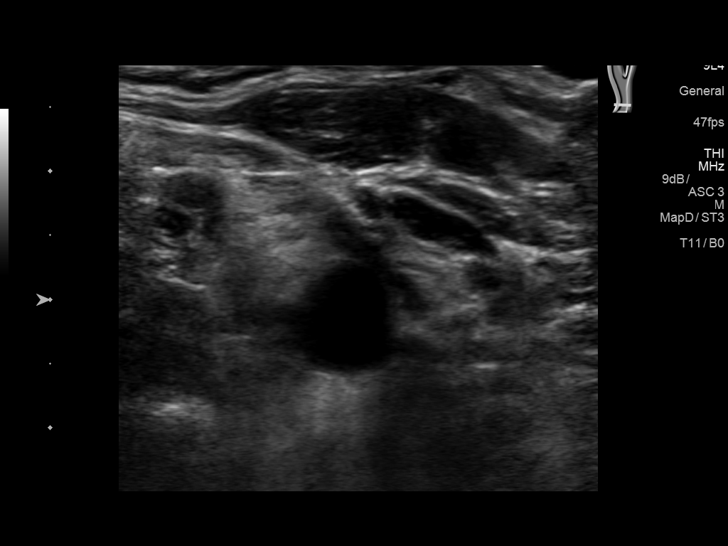
[im 6/69]
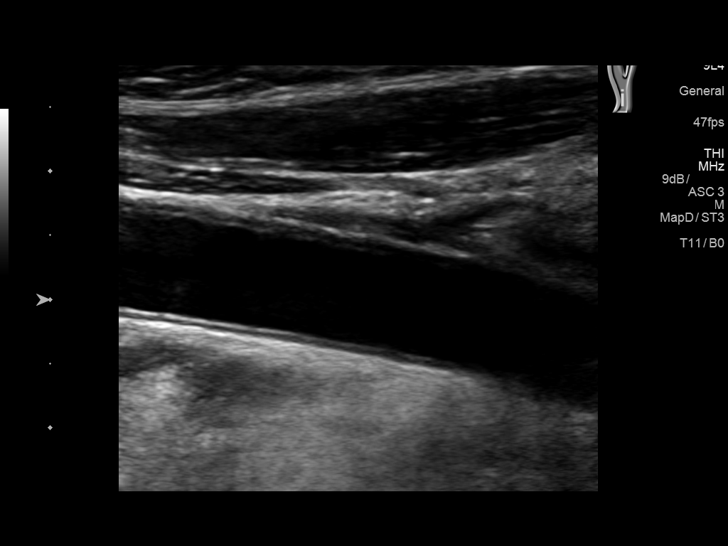
[im 12/69]
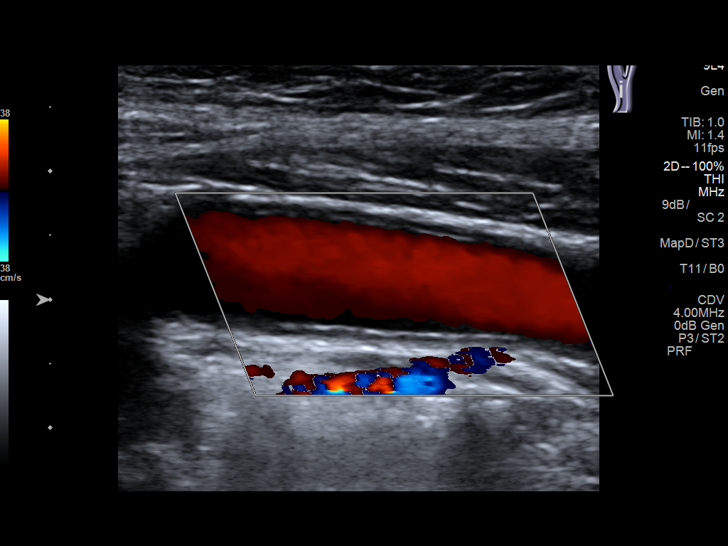
[im 18/69]
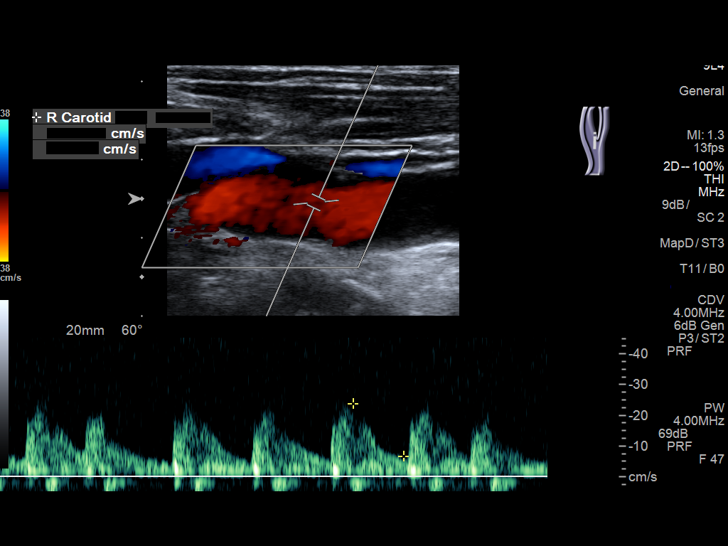
[im 24/69]
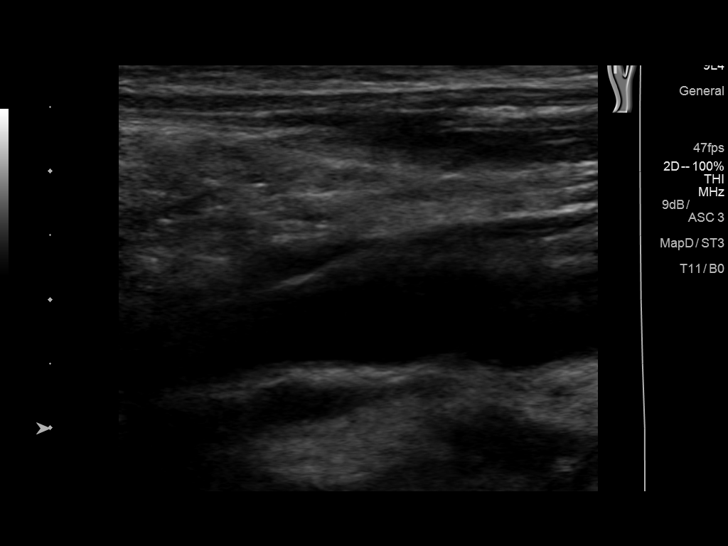
[im 30/69]
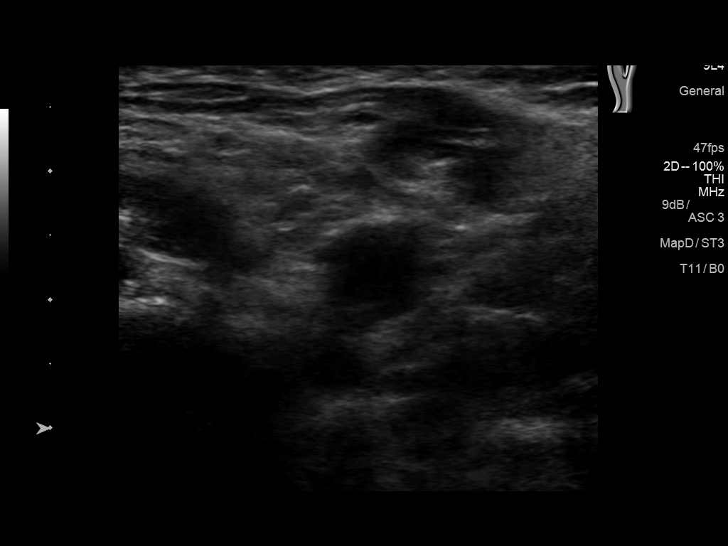
[im 36/69]
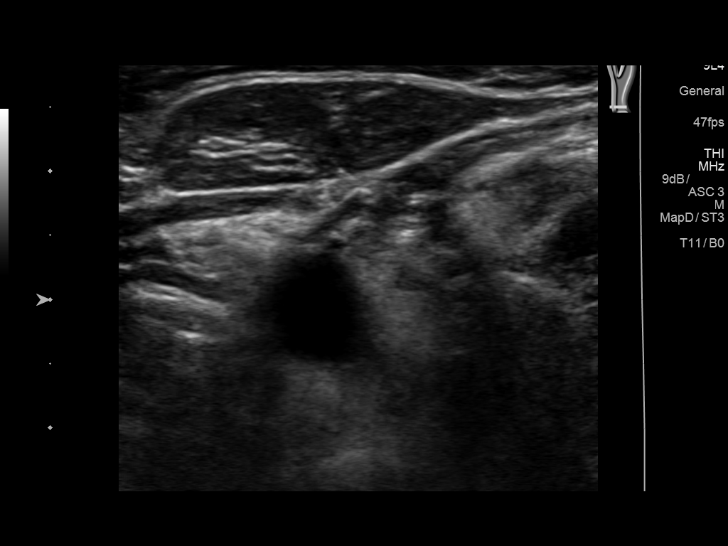
[im 39/69]
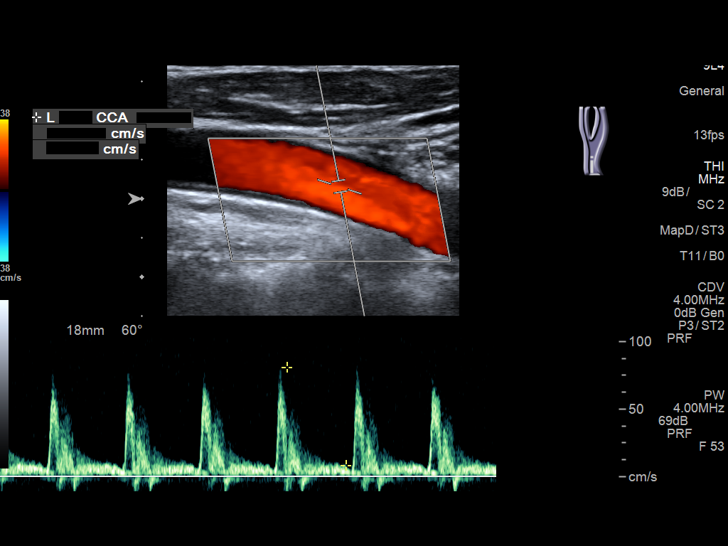
[im 45/69]
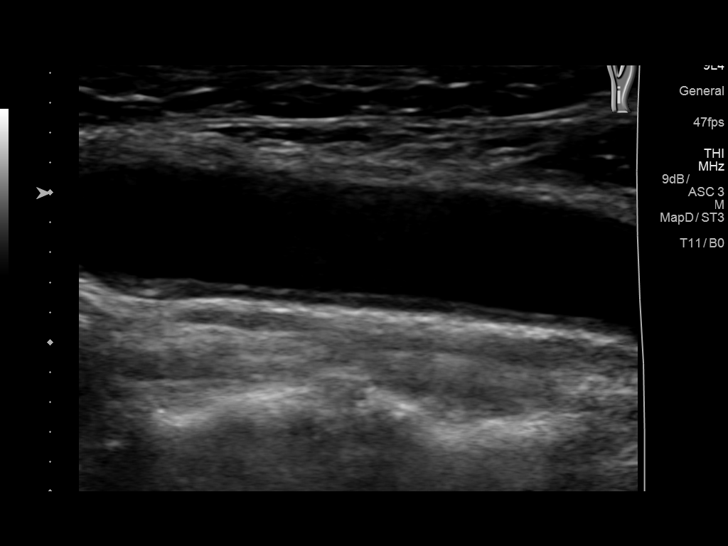
[im 51/69]
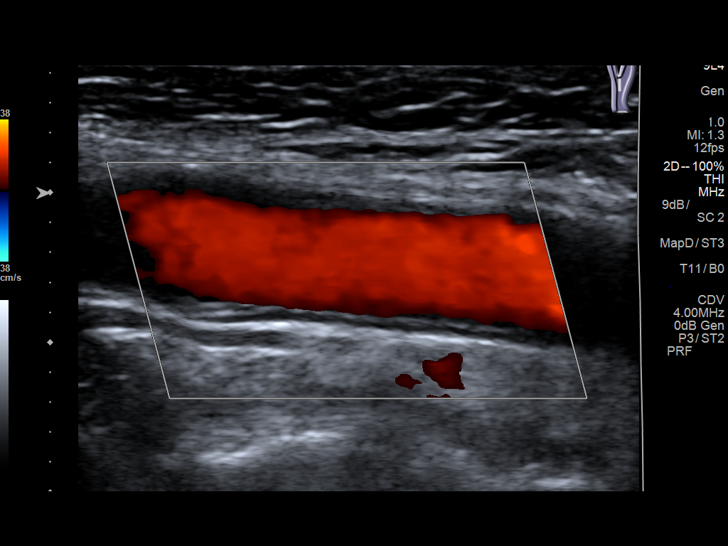
[im 57/69]
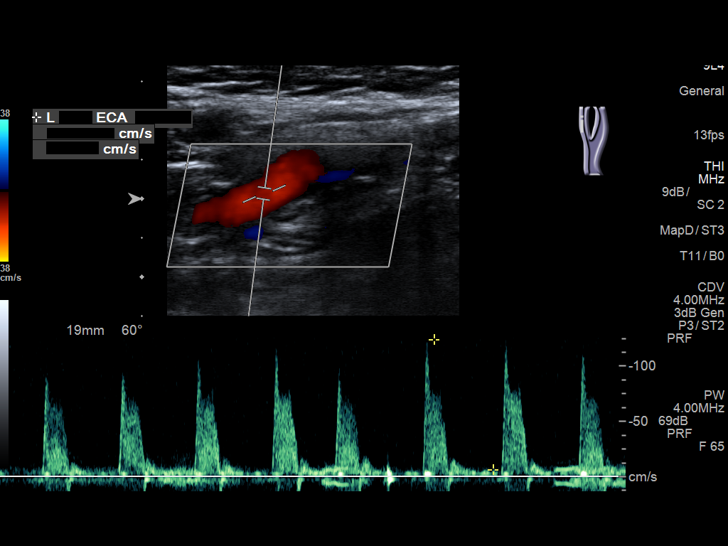
[im 63/69]
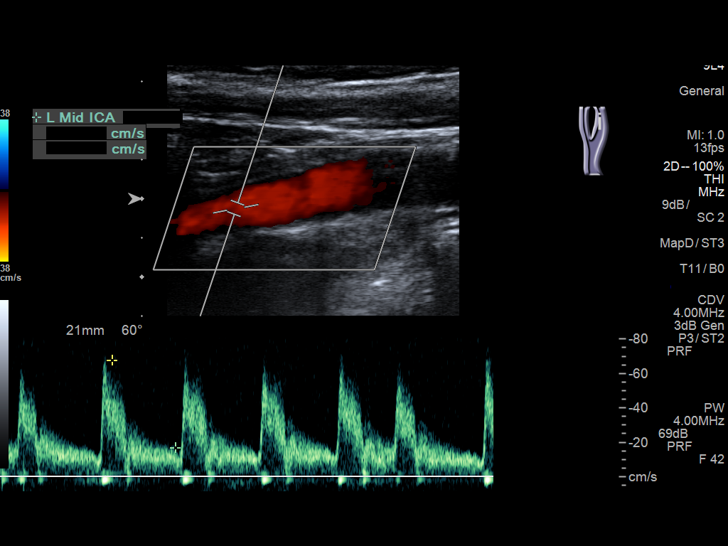
[im 69/69]
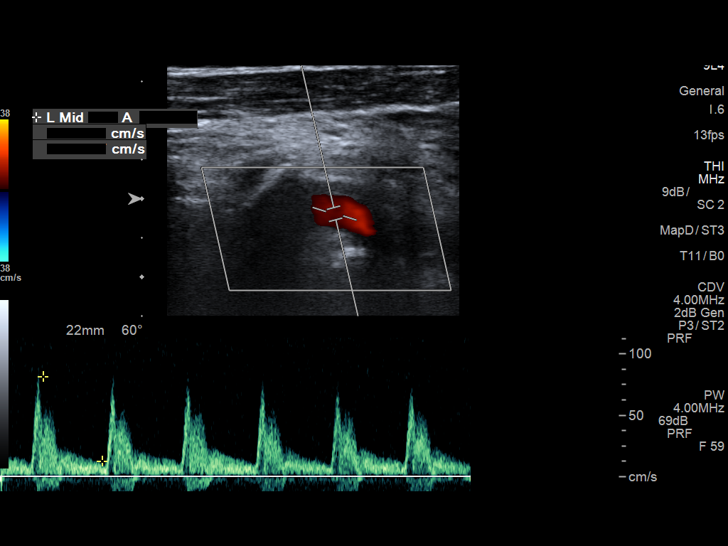

[13 of 24 positions shown; findings below may reference images not displayed]

FINDINGS: Criteria: Quantification of carotid stenosis is based on velocity
parameters that correlate the residual internal carotid diameter
with NASCET-based stenosis levels, using the diameter of the distal
internal carotid lumen as the denominator for stenosis measurement.

The following velocity measurements were obtained:

RIGHT

ICA:  85/20 cm/sec

CCA:  82/11 cm/sec

SYSTOLIC ICA/CCA RATIO:

DIASTOLIC ICA/CCA RATIO:

ECA:  58 cm/sec

LEFT

ICA:  96/25 cm/sec

CCA:  63/11 cm/sec

SYSTOLIC ICA/CCA RATIO:

DIASTOLIC ICA/CCA RATIO:

ECA:  124 cm/sec

RIGHT CAROTID ARTERY: No significant carotid atherosclerotic
vascular disease.

RIGHT VERTEBRAL ARTERY:  Patent with antegrade flow.

LEFT CAROTID ARTERY: No significant carotid atherosclerotic vascular
disease.

LEFT VERTEBRAL ARTERY:  Patent antegrade flow .
IMPRESSION: 1.  No significant carotid atherosclerotic vascular disease.

2.  Vertebral arteries are patent with antegrade flow.
